# Patient Record
Sex: Female | Born: 1980 | Race: White | Hispanic: No | Marital: Married
Health system: Midwestern US, Community
[De-identification: ages and names within clinical notes are randomized; demographics above are authoritative.]

## PROBLEM LIST (undated history)

## (undated) DIAGNOSIS — R300 Dysuria: Secondary | ICD-10-CM

## (undated) DIAGNOSIS — R310 Gross hematuria: Secondary | ICD-10-CM

## (undated) DIAGNOSIS — N390 Urinary tract infection, site not specified: Secondary | ICD-10-CM

## (undated) DIAGNOSIS — I341 Nonrheumatic mitral (valve) prolapse: Secondary | ICD-10-CM

## (undated) DIAGNOSIS — E039 Hypothyroidism, unspecified: Secondary | ICD-10-CM

## (undated) HISTORY — DX: Hypothyroidism, unspecified: E03.9

## (undated) HISTORY — DX: Nonrheumatic mitral (valve) prolapse: I34.1

---

## 2007-09-11 HISTORY — PX: WISDOM TOOTH EXTRACTION: SHX21

## 2011-06-06 ENCOUNTER — Ambulatory Visit (INDEPENDENT_AMBULATORY_CARE_PROVIDER_SITE_OTHER): Payer: PRIVATE HEALTH INSURANCE | Admitting: Family Medicine

## 2011-06-06 ENCOUNTER — Encounter: Payer: Self-pay | Admitting: Family Medicine

## 2011-06-06 DIAGNOSIS — Z331 Pregnant state, incidental: Secondary | ICD-10-CM | POA: Insufficient documentation

## 2011-06-06 DIAGNOSIS — B079 Viral wart, unspecified: Secondary | ICD-10-CM

## 2011-06-06 NOTE — Patient Instructions (Signed)
Follow up in 2-3 weeks for repeat freeze.

## 2011-06-06 NOTE — Progress Notes (Signed)
  Subjective:    Patient ID: Debra Singleton, female    DOB: May 16, 1981, 30 y.o.   MRN: [id number]  HPI She is pregnant and takes a MVI. Followed at [facility].  Had Pneaumonia shot in 2004.  Pap smera  In 8/12  Last week had ST post nasal drip, and some cough but feels she is getting better. No fever. Used some Tylenol sinu Pain and Congestion.     Wart on thumb and finger (RT hand). No OTC tx.  She says has already tried the duck tape method and didn't work well.   Review of Systems  Constitutional: Negative for fever, diaphoresis and unexpected weight change.  HENT: Negative for hearing loss, rhinorrhea and tinnitus.   Eyes: Negative for visual disturbance.  Respiratory: Positive for cough. Negative for wheezing.   Cardiovascular: Negative for chest pain and palpitations.  Gastrointestinal: Negative for nausea, vomiting, diarrhea and blood in stool.  Genitourinary: Negative for vaginal bleeding, vaginal discharge and difficulty urinating.  Musculoskeletal: Negative for myalgias and arthralgias.  Skin: Negative for rash.  Neurological: Negative for headaches.  Hematological: Negative for adenopathy. Does not bruise/bleed easily.  Psychiatric/Behavioral: Negative for sleep disturbance and dysphoric mood. The patient is not nervous/anxious.    BP 116/67  Pulse 74  Ht 5' 7.5" (1.715 m)  Wt 151 lb (68.493 kg)  BMI 23.30 kg/m2    No Known Allergies  History reviewed. No pertinent past medical history.  Past Surgical History  Procedure Date  . Wisdom tooth extraction 2009    History   Social History  . Marital Status: Married    Spouse Name: N/A    Number of Children: N/A  . Years of Education: N/A   Occupational History  . Not on file.   Social History Main Topics  . Smoking status: Never Smoker   . Smokeless tobacco: Not on file  . Alcohol Use: No  . Drug Use: No  . Sexually Active: Yes   Other Topics Concern  . Not on file   Social History Narrative   Exercises regularly.  Rare caffeine     Family History  Problem Relation Age of Onset  . Colon cancer Paternal Grandmother     smoker  . Heart attack Maternal Grandfather   . Diabetes Paternal Grandmother   . Hyperlipidemia      grandparent.   . Hypertension      grandparents  . Heart attack Paternal Grandfather     Debra Singleton does not currently have medications on file.     Objective:   Physical Exam  Constitutional: She is oriented to person, place, and time. She appears well-developed and well-nourished.  HENT:  Head: Normocephalic and atraumatic.  Cardiovascular: Normal rate, regular rhythm and normal heart sounds.   Pulmonary/Chest: Effort normal and breath sounds normal.  Neurological: She is alert and oriented to person, place, and time.  Skin: Skin is warm and dry.       Wart on tip of rt thumb and first finger on the Rt hand .  Psychiatric: She has a normal mood and affect. Her behavior is normal.          Assessment & Plan:  URI- Resolving.  Call if not continuing to improve.   Wart on RT hand - Cryotherapy performed. Pt tolerated well. F/U in 2-3 weeks for refreeze.

## 2011-06-19 ENCOUNTER — Encounter: Payer: Self-pay | Admitting: Family Medicine

## 2011-06-19 ENCOUNTER — Ambulatory Visit (INDEPENDENT_AMBULATORY_CARE_PROVIDER_SITE_OTHER): Payer: PRIVATE HEALTH INSURANCE | Admitting: Family Medicine

## 2011-06-19 DIAGNOSIS — B079 Viral wart, unspecified: Secondary | ICD-10-CM

## 2011-06-19 NOTE — Progress Notes (Signed)
  Subjective:    Patient ID: Debra Singleton, female    DOB: 1981/09/07, 30 y.o.   MRN: [id number]  HPI  Here for refreeze on wart.    Review of Systems     Objective:   Physical Exam        Assessment & Plan:  She has thus far had a good response to the first freezing.  Cyrotherapy performed today.  Pt tolerated well. FU in 2 weeks for refreeze.

## 2011-07-26 ENCOUNTER — Encounter: Payer: Self-pay | Admitting: Family Medicine

## 2011-07-26 ENCOUNTER — Ambulatory Visit (INDEPENDENT_AMBULATORY_CARE_PROVIDER_SITE_OTHER): Payer: PRIVATE HEALTH INSURANCE | Admitting: Family Medicine

## 2011-07-26 DIAGNOSIS — B079 Viral wart, unspecified: Secondary | ICD-10-CM

## 2011-07-26 NOTE — Progress Notes (Signed)
  Subjective:    Patient ID: Debra Singleton, female    DOB: [birth year], 30 y.o.   MRN: [id number]  HPI  Cryotherapy on 2 warts on her hand.  Followup.   Review of Systems     Objective:   Physical Exam        Assessment & Plan:  Cryo performed on 2 warts. Pt tolerated well.

## 2011-08-13 ENCOUNTER — Encounter: Payer: Self-pay | Admitting: Family Medicine

## 2011-08-13 ENCOUNTER — Ambulatory Visit (INDEPENDENT_AMBULATORY_CARE_PROVIDER_SITE_OTHER): Payer: PRIVATE HEALTH INSURANCE | Admitting: Family Medicine

## 2011-08-13 DIAGNOSIS — B079 Viral wart, unspecified: Secondary | ICD-10-CM

## 2011-08-13 NOTE — Patient Instructions (Signed)
Cal if not getting good response to this freezine.

## 2011-08-13 NOTE — Progress Notes (Signed)
  Subjective:    Patient ID: Debra Singleton, female    DOB: 01-Jul-1981, 30 y.o.   MRN: [id number]  HPI Here for repeat cryo on her first and second finger.  She has toleated this well.  They look about the same.  She tried to debride them some before coming in.      Review of Systems     Objective:   Physical Exam  Cryo performed on the first finger and thumb for wart.        Assessment & Plan:  Cryotherapy - Pt tolerated well. Will place Emla cream on fingertips and should help numb.  If not helping then consider refer to Dermatology for further evaluation.

## 2012-01-30 ENCOUNTER — Encounter: Payer: Self-pay | Admitting: Physician Assistant

## 2012-01-30 ENCOUNTER — Ambulatory Visit (INDEPENDENT_AMBULATORY_CARE_PROVIDER_SITE_OTHER): Payer: PRIVATE HEALTH INSURANCE | Admitting: Physician Assistant

## 2012-01-30 VITALS — BP 117/74 | HR 84 | Ht 67.0 in | Wt 163.0 lb

## 2012-01-30 DIAGNOSIS — R202 Paresthesia of skin: Secondary | ICD-10-CM

## 2012-01-30 DIAGNOSIS — R209 Unspecified disturbances of skin sensation: Secondary | ICD-10-CM

## 2012-01-30 DIAGNOSIS — R200 Anesthesia of skin: Secondary | ICD-10-CM

## 2012-01-30 DIAGNOSIS — I341 Nonrheumatic mitral (valve) prolapse: Secondary | ICD-10-CM

## 2012-01-30 DIAGNOSIS — R7989 Other specified abnormal findings of blood chemistry: Secondary | ICD-10-CM

## 2012-01-30 DIAGNOSIS — F419 Anxiety disorder, unspecified: Secondary | ICD-10-CM

## 2012-01-30 MED ORDER — SERTRALINE HCL 50 MG PO TABS: 50.0000 mg | ORAL_TABLET | Freq: Every day | ORAL | Status: DC

## 2012-01-30 NOTE — Progress Notes (Signed)
  Subjective:    Patient ID: Debra Singleton, female    DOB: 04/20/1981, 31 y.o.   MRN: [id number]  HPI Patient presents to the clinic because last night she started feeling week all of a sudden after taking a nap. She thought is was low blood sugar because she had that before. Ginger ale did not help and she still remained feeling weak along with numbness and tingling down both arms into hands. She did not pass out or feel dizzy. She denies vision changes. She felt like her tongue was huge. She denies eating anything or being exposed to anything she was allergic too. She did take benadryl and it did not help. When she layed down the tingling went into her jaw and got worse. She denies any CP or tightness, palpitations, or SOB. She felt like she had to force herself to talk. She drank orange juice and then then 30 minutes later she began to feel better. She denies any neck trauma or injury. Her extremities were very cold during episode. She has just had a baby 2 months ago. She denies depression but has been feeling a little anxious lately. Pt does not think this was due to anxiety. She does not have a hx of multiple sclerosis. This was first episode. She does have a hx of MVP.       Review of Systems     Objective:   Physical Exam  Constitutional: She is oriented to person, place, and time. She appears well-developed and well-nourished.  HENT:  Head: Normocephalic and atraumatic.  Neck: Normal range of motion. Neck supple.       Normal neck ROM. No tenderness to palpation of neck.  Cardiovascular: Normal rate, regular rhythm and intact distal pulses.        Mid-systolic click barely heard with heart sounds.    Pulmonary/Chest: Effort normal and breath sounds normal. She has no wheezes.  Musculoskeletal:       Upper extremitity strength 5/5. Normal ROM of both arms and hands. Hand strength bilaterally 5/5. DTR's 2+ antecubital. No tenderness to palpation on either arm.   Neurological: She  is alert and oriented to person, place, and time.  Skin: Skin is warm and dry.  Psychiatric:       Very anxious today.          Assessment & Plan:  Numbness and tingling of both hands and arms/Anxiety- EKG sinus arrhytmia, no st elevation or depression, normal axis and rate. Suspect there might be some Anxiety related compenent. 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 Encouraged to start Zoloft 50mg  1/2 tab for 7 days then increase to 1 tab daily. Will check electrolytes, TSH, CBC and call with results. Want to rule out anemia post-partum, electrolyte embalance, or TSH problems.  Pt instructed to call office if have another episode like this one. Encouraged her to stay hydrated. Follow up in 6 weeks.

## 2012-01-30 NOTE — Patient Instructions (Addendum)
Start Zoloft 1/2 tab for 7 days then increase to 1 tab. Recheck in 6 weeks.  If have another exacerbation please call office and we can get stress test. Will call with labs.

## 2012-01-31 ENCOUNTER — Other Ambulatory Visit: Payer: Self-pay | Admitting: Physician Assistant

## 2012-01-31 ENCOUNTER — Telehealth: Payer: Self-pay | Admitting: *Deleted

## 2012-01-31 DIAGNOSIS — I341 Nonrheumatic mitral (valve) prolapse: Secondary | ICD-10-CM

## 2012-01-31 LAB — COMPLETE METABOLIC PANEL WITH GFR
AST: 20 U/L (ref 0–37)
Albumin: 4.6 g/dL (ref 3.5–5.2)
BUN: 25 mg/dL — ABNORMAL HIGH (ref 6–23)
CO2: 24 mEq/L (ref 19–32)
Calcium: 9.5 mg/dL (ref 8.4–10.5)
Chloride: 104 mEq/L (ref 96–112)
Creat: 0.96 mg/dL (ref 0.50–1.10)
GFR, Est African American: >89 mL/min
GFR, Est Non African American: 80 mL/min
Glucose, Bld: 84 mg/dL (ref 70–99)
Potassium: 4.2 mEq/L (ref 3.5–5.3)

## 2012-01-31 LAB — TSH: TSH: 6.214 u[IU]/mL — ABNORMAL HIGH (ref 0.350–4.500)

## 2012-01-31 LAB — CBC WITH DIFFERENTIAL/PLATELET
Basophils Absolute: 0 10*3/uL (ref 0.0–0.1)
Eosinophils Relative: 7 % — ABNORMAL HIGH (ref 0–5)
Lymphocytes Relative: 34 % (ref 12–46)
MCV: 90.3 fL (ref 78.0–100.0)
Neutrophils Relative %: 51 % (ref 43–77)
Platelets: 275 10*3/uL (ref 150–400)
RBC: 4.87 MIL/uL (ref 3.87–5.11)
RDW: 13.4 % (ref 11.5–15.5)
WBC: 7.9 10*3/uL (ref 4.0–10.5)

## 2012-01-31 MED ORDER — LEVOTHYROXINE SODIUM 100 MCG PO CAPS: 100.0000 ug | ORAL_CAPSULE | Freq: Every day | ORAL | Status: DC

## 2012-01-31 NOTE — Progress Notes (Signed)
rx sent

## 2012-01-31 NOTE — Telephone Encounter (Signed)
Pt calls and wants lab results and explained you were out of office and we would contact her tomorrow with results. Pt also request to have a cardiology referral because of her mitral valve and the fact that she will be loosing insurance soon

## 2012-02-01 ENCOUNTER — Other Ambulatory Visit: Payer: Self-pay | Admitting: Physician Assistant

## 2012-02-01 ENCOUNTER — Encounter: Payer: Self-pay | Admitting: Physician Assistant

## 2012-02-01 ENCOUNTER — Encounter: Payer: Self-pay | Admitting: *Deleted

## 2012-02-01 DIAGNOSIS — I341 Nonrheumatic mitral (valve) prolapse: Secondary | ICD-10-CM | POA: Insufficient documentation

## 2012-02-01 DIAGNOSIS — O905 Postpartum thyroiditis: Secondary | ICD-10-CM | POA: Insufficient documentation

## 2012-02-01 LAB — T4, FREE: Free T4: 1.21 ng/dL (ref 0.80–1.80)

## 2012-02-01 NOTE — Telephone Encounter (Signed)
Let patient know that referral has been sent. If not called in next week call office back.

## 2012-02-01 NOTE — Progress Notes (Signed)
Addended by: Ellsworth Lennox on: 02/01/2012 09:43 AM   Modules accepted: Orders

## 2012-02-05 ENCOUNTER — Telehealth: Payer: Self-pay | Admitting: *Deleted

## 2012-02-05 NOTE — Telephone Encounter (Signed)
Pt ask to inform you that she started taking the Levothyroxine on Saturday.

## 2012-02-06 ENCOUNTER — Encounter: Payer: Self-pay | Admitting: Cardiology

## 2012-02-06 ENCOUNTER — Ambulatory Visit (INDEPENDENT_AMBULATORY_CARE_PROVIDER_SITE_OTHER): Payer: PRIVATE HEALTH INSURANCE | Admitting: Cardiology

## 2012-02-06 DIAGNOSIS — R079 Chest pain, unspecified: Secondary | ICD-10-CM

## 2012-02-06 DIAGNOSIS — R0989 Other specified symptoms and signs involving the circulatory and respiratory systems: Secondary | ICD-10-CM

## 2012-02-06 DIAGNOSIS — R0600 Dyspnea, unspecified: Secondary | ICD-10-CM | POA: Insufficient documentation

## 2012-02-06 DIAGNOSIS — I059 Rheumatic mitral valve disease, unspecified: Secondary | ICD-10-CM

## 2012-02-06 DIAGNOSIS — O905 Postpartum thyroiditis: Secondary | ICD-10-CM

## 2012-02-06 DIAGNOSIS — O99285 Endocrine, nutritional and metabolic diseases complicating the puerperium: Secondary | ICD-10-CM

## 2012-02-06 DIAGNOSIS — R0609 Other forms of dyspnea: Secondary | ICD-10-CM

## 2012-02-06 DIAGNOSIS — I341 Nonrheumatic mitral (valve) prolapse: Secondary | ICD-10-CM

## 2012-02-06 NOTE — Assessment & Plan Note (Signed)
Patient does not appear to be volume overloaded on examination. Electrocardiogram normal. Given recent delivery check d-dimer. I think pulmonary embolus is less likely but worthwhile to exclude. If elevated we'll proceed with CT scan.

## 2012-02-06 NOTE — Assessment & Plan Note (Signed)
Management per primary care. 

## 2012-02-06 NOTE — Assessment & Plan Note (Signed)
Repeat echocardiogram. 

## 2012-02-06 NOTE — Progress Notes (Signed)
  HPI: 31 year old female with past medical history of mitral valve prolapse for evaluation of dyspnea. Patient is status post recent delivery of her second child 2 months ago. Last week she had an episode where she felt suddenly fatigued. She sat down and then developed numbness in her upper extremities bilaterally. She also had some difficulty speaking because of the tongue swelling. She also has noticed some dyspnea on exertion which is new. There is no orthopnea, PND or pedal edema. She has not had syncope. No exertional chest pain. Because of the above she presented for further evaluation. Note her TSH was noted to be increased and she has been started on levothyroxine.  Current Outpatient Prescriptions  Medication Sig Dispense Refill  . Levothyroxine Sodium 100 MCG CAPS Take 1 capsule (100 mcg total) by mouth daily. 30 minutes before breakfast.  30 capsule  1  . Prenatal Vitamins (DIS) TABS Take by mouth.        . sertraline (ZOLOFT) 50 MG tablet Take 1 tablet (50 mg total) by mouth daily. Take 1/2 tab for 7 days and then increase to 1 day.  30 tablet  1    No Known Allergies  Past Medical History  Diagnosis Date  . Hypothyroid   . Mitral valve prolapse     Past Surgical History  Procedure Date  . Wisdom tooth extraction 2009    History   Social History  . Marital Status: Married    Spouse Name: N/A    Number of Children: 2  . Years of Education: N/A   Occupational History  .      Registered Nurse   Social History Main Topics  . Smoking status: Never Smoker   . Smokeless tobacco: Not on file  . Alcohol Use: Yes     Rare  . Drug Use: No  . Sexually Active: Yes   Other Topics Concern  . Not on file   Social History Narrative   Exercises regularly.  Rare caffeine     Family History  Problem Relation Age of Onset  . Colon cancer Paternal Grandmother     smoker  . Heart attack Maternal Grandfather   . Diabetes Paternal Grandmother   . Hyperlipidemia     grandparent.   . Hypertension      grandparents  . Heart attack Paternal Grandfather     ROS:  no fevers or chills, productive cough, hemoptysis, dysphasia, odynophagia, melena, hematochezia, dysuria, hematuria, rash, seizure activity, orthopnea, PND, pedal edema, claudication. Remaining systems are negative.  Physical Exam:   Blood pressure 110/72, pulse 68, height 5\' 7"  (1.702 m), weight 73.936 kg (163 lb).  General:  Well developed/well nourished in NAD Skin warm/dry Patient not depressed No peripheral clubbing Back-normal HEENT-normal/normal eyelids Neck supple/normal carotid upstroke bilaterally; no bruits; no JVD; no thyromegaly chest - CTA/ normal expansion CV - RRR/normal S1 and S2; no rubs or gallops;  PMI nondisplaced, 1/6 systolic ejection murmur Abdomen -NT/ND, no HSM, no mass, + bowel sounds, no bruit 2+ femoral pulses, no bruits Ext-no edema, chords, 2+ DP Neuro-grossly nonfocal  ECG 005/20/2013-sinus rhythm with no ST changes.

## 2012-02-06 NOTE — Patient Instructions (Signed)
Your physician recommends that you schedule a follow-up appointment in: AS NEEDED PENDING TEST RESULTS  Your physician has requested that you have an echocardiogram. Echocardiography is a painless test that uses sound waves to create images of your heart. It provides your doctor with information about the size and shape of your heart and how well your heart's chambers and valves are working. This procedure takes approximately one hour. There are no restrictions for this procedure.   Your physician recommends that you HAVE LAB WORK TODAY  

## 2012-02-07 LAB — D-DIMER, QUANTITATIVE: D-Dimer, Quant: 0.28 ug/mL-FEU (ref 0.00–0.48)

## 2012-02-12 ENCOUNTER — Ambulatory Visit (HOSPITAL_COMMUNITY): Payer: PRIVATE HEALTH INSURANCE | Attending: Cardiovascular Disease | Admitting: Radiology

## 2012-02-12 DIAGNOSIS — R0609 Other forms of dyspnea: Secondary | ICD-10-CM | POA: Insufficient documentation

## 2012-02-12 DIAGNOSIS — R011 Cardiac murmur, unspecified: Secondary | ICD-10-CM | POA: Insufficient documentation

## 2012-02-12 DIAGNOSIS — I059 Rheumatic mitral valve disease, unspecified: Secondary | ICD-10-CM | POA: Insufficient documentation

## 2012-02-12 DIAGNOSIS — R072 Precordial pain: Secondary | ICD-10-CM | POA: Insufficient documentation

## 2012-02-12 DIAGNOSIS — R0989 Other specified symptoms and signs involving the circulatory and respiratory systems: Secondary | ICD-10-CM | POA: Insufficient documentation

## 2012-02-12 DIAGNOSIS — R5381 Other malaise: Secondary | ICD-10-CM | POA: Insufficient documentation

## 2012-02-12 NOTE — Progress Notes (Signed)
Echocardiogram performed.  

## 2012-03-02 ENCOUNTER — Other Ambulatory Visit: Payer: Self-pay | Admitting: Physician Assistant

## 2012-03-05 ENCOUNTER — Telehealth: Payer: Self-pay | Admitting: *Deleted

## 2012-03-05 NOTE — Telephone Encounter (Signed)
Pt informed

## 2012-03-05 NOTE — Telephone Encounter (Signed)
No safe meds since breast feeding. Stay hydrated. Ginger is something natural that you can try to help with nausea.

## 2012-03-05 NOTE — Telephone Encounter (Signed)
Pt states she has been up since about 2 am with vomiting and diarrhea. States she is also breast feeding and that she hasn't taken her Synthroid due to not being able to keep anything down. Pt would like to know if we can call in something for the n/v. Please advise

## 2012-03-10 ENCOUNTER — Encounter: Payer: Self-pay | Admitting: Family Medicine

## 2012-03-10 ENCOUNTER — Ambulatory Visit (INDEPENDENT_AMBULATORY_CARE_PROVIDER_SITE_OTHER): Payer: PRIVATE HEALTH INSURANCE | Admitting: Family Medicine

## 2012-03-10 VITALS — BP 106/70 | HR 72 | Ht 67.0 in | Wt 158.0 lb

## 2012-03-10 DIAGNOSIS — E079 Disorder of thyroid, unspecified: Secondary | ICD-10-CM

## 2012-03-10 DIAGNOSIS — O905 Postpartum thyroiditis: Secondary | ICD-10-CM

## 2012-03-10 DIAGNOSIS — E039 Hypothyroidism, unspecified: Secondary | ICD-10-CM

## 2012-03-10 NOTE — Progress Notes (Signed)
  Subjective:    Patient ID: Debra Singleton, female    DOB: 01/15/1981, 31 y.o.   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 Was complaining of severe fatigue initiallly and found the hypothyroid on labwork. t3 and t4 was normal. She feels it is from new baby and has been nursing and has been pumping.  She has gastroenteritis last week.  Says tha tis  Better. Fatigue is better.  Says hand numbness is better.  Says the other night couldn't sleep and wonders if dose on med may be too much.  No sweats.  Says hairloss has improved.  Skin has improved.      Review of Systems     Objective:   Physical Exam  Constitutional: She is oriented to person, place, and time. She appears well-developed and well-nourished.  HENT:  Head: Normocephalic and atraumatic.  Eyes: Conjunctivae are normal. Pupils are equal, round, and reactive to light.  Neck: Neck supple. No tracheal deviation present. No thyromegaly present.       Thyroid is nonenlarged. Mildly tender on exam. No distinct nodules. No induration.  Cardiovascular: Normal rate, regular rhythm and normal heart sounds.   Pulmonary/Chest: Effort normal and breath sounds normal.  Musculoskeletal: She exhibits no edema.  Lymphadenopathy:    She has no cervical adenopathy.  Neurological: She is alert and oriented to person, place, and time.  Skin: Skin is warm and dry.  Psychiatric: She has a normal mood and affect. Her behavior is normal.          Assessment & Plan:  Hypothyroid - Recheck TSH, Free T3, Free t4. Will call with results.  May need to adjust her dose, sounds like she may be hyperthyroid.    Pt never took the zoloft. I think most of her symptoms were explained by the hyperthyroidism. I see no signs of anxiety or depression today.  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

## 2012-03-10 NOTE — Patient Instructions (Addendum)
We will call you with your lab results. If you don't here from us in about a week then please give us a call at [phone].  

## 2012-03-11 ENCOUNTER — Other Ambulatory Visit: Payer: Self-pay | Admitting: Family Medicine

## 2012-03-11 LAB — T4, FREE: Free T4: 1.4 ng/dL (ref 0.80–1.80)

## 2012-03-11 MED ORDER — LEVOTHYROXINE SODIUM 88 MCG PO TABS: 88.0000 ug | ORAL_TABLET | Freq: Every day | ORAL | Status: DC

## 2012-05-08 ENCOUNTER — Telehealth: Payer: Self-pay | Admitting: *Deleted

## 2012-05-08 DIAGNOSIS — E039 Hypothyroidism, unspecified: Secondary | ICD-10-CM

## 2012-05-09 LAB — TSH: TSH: 0.905 u[IU]/mL (ref 0.350–4.500)

## 2012-05-14 ENCOUNTER — Other Ambulatory Visit: Payer: Self-pay | Admitting: Family Medicine

## 2012-05-14 ENCOUNTER — Other Ambulatory Visit: Payer: Self-pay | Admitting: *Deleted

## 2012-05-14 MED ORDER — LEVOTHYROXINE SODIUM 88 MCG PO TABS: 88.0000 ug | ORAL_TABLET | Freq: Every day | ORAL | Status: DC

## 2012-08-26 ENCOUNTER — Other Ambulatory Visit: Payer: Self-pay

## 2012-08-26 MED ORDER — LEVOTHYROXINE SODIUM 88 MCG PO TABS: 88.0000 ug | ORAL_TABLET | Freq: Every day | ORAL | Status: DC

## 2012-09-22 ENCOUNTER — Encounter: Payer: Self-pay | Admitting: Physician Assistant

## 2012-09-22 ENCOUNTER — Ambulatory Visit (INDEPENDENT_AMBULATORY_CARE_PROVIDER_SITE_OTHER): Payer: BC Managed Care – PPO | Admitting: Physician Assistant

## 2012-09-22 VITALS — BP 116/74 | HR 74 | Wt 145.0 lb

## 2012-09-22 DIAGNOSIS — E039 Hypothyroidism, unspecified: Secondary | ICD-10-CM

## 2012-09-22 DIAGNOSIS — R319 Hematuria, unspecified: Secondary | ICD-10-CM

## 2012-09-22 DIAGNOSIS — O905 Postpartum thyroiditis: Secondary | ICD-10-CM

## 2012-09-22 DIAGNOSIS — N390 Urinary tract infection, site not specified: Secondary | ICD-10-CM

## 2012-09-22 DIAGNOSIS — R300 Dysuria: Secondary | ICD-10-CM

## 2012-09-22 LAB — POCT URINALYSIS DIPSTICK
Ketones, UA: NEGATIVE
Protein, UA: 100
Spec Grav, UA: >=1.03
Urobilinogen, UA: 0.2
pH, UA: 5.5

## 2012-09-22 MED ORDER — CIPROFLOXACIN HCL 500 MG PO TABS: 500.0000 mg | ORAL_TABLET | Freq: Two times a day (BID) | ORAL | Status: DC

## 2012-09-22 NOTE — Patient Instructions (Signed)
Urinary Tract Infection Urinary tract infections (UTIs) can develop anywhere along your urinary tract. Your urinary tract is your body's drainage system for removing wastes and extra water. Your urinary tract includes two kidneys, two ureters, a bladder, and a urethra. Your kidneys are a pair of bean-shaped organs. Each kidney is about the size of your fist. They are located below your ribs, one on each side of your spine. CAUSES Infections are caused by microbes, which are microscopic organisms, including fungi, viruses, and bacteria. These organisms are so small that they can only be seen through a microscope. Bacteria are the microbes that most commonly cause UTIs. SYMPTOMS  Symptoms of UTIs may vary by age and gender of the patient and by the location of the infection. Symptoms in young women typically include a frequent and intense urge to urinate and a painful, burning feeling in the bladder or urethra during urination. Older women and men are more likely to be tired, shaky, and weak and have muscle aches and abdominal pain. A fever may mean the infection is in your kidneys. Other symptoms of a kidney infection include pain in your back or sides below the ribs, nausea, and vomiting. DIAGNOSIS To diagnose a UTI, your caregiver will ask you about your symptoms. Your caregiver also will ask to provide a urine sample. The urine sample will be tested for bacteria and white blood cells. White blood cells are made by your body to help fight infection. TREATMENT  Typically, UTIs can be treated with medication. Because most UTIs are caused by a bacterial infection, they usually can be treated with the use of antibiotics. The choice of antibiotic and length of treatment depend on your symptoms and the type of bacteria causing your infection. HOME CARE INSTRUCTIONS  If you were prescribed antibiotics, take them exactly as your caregiver instructs you. Finish the medication even if you feel better after you  have only taken some of the medication.  Drink enough water and fluids to keep your urine clear or pale yellow.  Avoid caffeine, tea, and carbonated beverages. They tend to irritate your bladder.  Empty your bladder often. Avoid holding urine for long periods of time.  Empty your bladder before and after sexual intercourse.  After a bowel movement, women should cleanse from front to back. Use each tissue only once. SEEK MEDICAL CARE IF:   You have back pain.  You develop a fever.  Your symptoms do not begin to resolve within 3 days. SEEK IMMEDIATE MEDICAL CARE IF:   You have severe back pain or lower abdominal pain.  You develop chills.  You have nausea or vomiting.  You have continued burning or discomfort with urination. MAKE SURE YOU:   Understand these instructions.  Will watch your condition.  Will get help right away if you are not doing well or get worse. Document Released: 06/06/2005 Document Revised: 02/26/2012 Document Reviewed: 10/05/2011 [employer] Patient Information 2013 [employer].  

## 2012-09-22 NOTE — Progress Notes (Signed)
  Subjective:    Patient ID: Debra Singleton, female    DOB: 03-29-1981, 31 y.o.   MRN: [id number]  Dysuria  This is a new problem. The current episode started yesterday. The problem occurs every urination. The problem has been rapidly worsening. The quality of the pain is described as aching, burning and shooting. The pain is at a severity of 7/10. The pain is moderate. There has been no fever. She is sexually active. There is no history of pyelonephritis. Associated symptoms include frequency, hematuria and urgency. Pertinent negatives include no chills, discharge, flank pain, hesitancy, nausea, possible pregnancy, sweats or vomiting. She has tried nothing for the symptoms. The treatment provided no relief. Last UTI was last year.   Patient also has post-partum thyroiditis. She is on synthyroid. She was supposed to come in within the next couple weeks for TSH recheck. She reports that she is feeling great on current dose. She has plenty of energy and is exercising regularly. She denies any tenderness or neck swelling around the thyroid gland. She recently stopped breast-feeding at the beginning of December. She is able to sleep well throughout the night.   Review of Systems  Constitutional: Negative for chills.  Gastrointestinal: Negative for nausea and vomiting.  Genitourinary: Positive for dysuria, urgency, frequency and hematuria. Negative for hesitancy and flank pain.       Objective:   Physical Exam  Constitutional: She is oriented to person, place, and time. She appears well-developed and well-nourished.  HENT:  Head: Normocephalic and atraumatic.  Neck: Normal range of motion. Neck supple. No thyromegaly present.       Or tenderness with palpation over the thyroid.  Cardiovascular: Normal rate, regular rhythm and normal heart sounds.   Pulmonary/Chest: Effort normal and breath sounds normal.  Abdominal: Soft. Bowel sounds are normal. She exhibits no distension and no mass. There is  no guarding.       Mild tenderness over the lower abdominal quadrant bilaterally.  Neurological: She is alert and oriented to person, place, and time.  Skin: Skin is warm and dry.  Psychiatric: She has a normal mood and affect. Her behavior is normal.          Assessment & Plan:  UTI-UA was positive for blood, nitrates, and leukocyte. She was treated with 3 days of Cipro. She is given a handout for symptomatic treatment of UTI. She was encouraged to try a set of for 3 days over-the-counter for any bladder irritation. Followup if not improving.  Postpartum thyroiditis/hypothyroidism-patient was given lab slip to have TSH drawn. We'll make changes as needed.

## 2012-10-28 LAB — TSH: TSH: 0.651 u[IU]/mL (ref 0.350–4.500)

## 2012-10-29 ENCOUNTER — Other Ambulatory Visit: Payer: Self-pay | Admitting: *Deleted

## 2012-10-29 MED ORDER — LEVOTHYROXINE SODIUM 88 MCG PO TABS: 88.0000 ug | ORAL_TABLET | Freq: Every day | ORAL | Status: DC

## 2013-02-27 ENCOUNTER — Ambulatory Visit (INDEPENDENT_AMBULATORY_CARE_PROVIDER_SITE_OTHER): Payer: PRIVATE HEALTH INSURANCE | Admitting: Physician Assistant

## 2013-02-27 ENCOUNTER — Encounter: Payer: Self-pay | Admitting: Physician Assistant

## 2013-02-27 VITALS — BP 130/77 | HR 71 | Wt 139.0 lb

## 2013-02-27 DIAGNOSIS — O905 Postpartum thyroiditis: Secondary | ICD-10-CM

## 2013-02-27 DIAGNOSIS — R8271 Bacteriuria: Secondary | ICD-10-CM

## 2013-02-27 DIAGNOSIS — S060X0D Concussion without loss of consciousness, subsequent encounter: Secondary | ICD-10-CM

## 2013-02-27 DIAGNOSIS — S060X9A Concussion with loss of consciousness of unspecified duration, initial encounter: Secondary | ICD-10-CM | POA: Insufficient documentation

## 2013-02-27 DIAGNOSIS — E079 Disorder of thyroid, unspecified: Secondary | ICD-10-CM

## 2013-02-27 DIAGNOSIS — R82998 Other abnormal findings in urine: Secondary | ICD-10-CM

## 2013-02-27 DIAGNOSIS — R300 Dysuria: Secondary | ICD-10-CM

## 2013-02-27 DIAGNOSIS — Z5189 Encounter for other specified aftercare: Secondary | ICD-10-CM

## 2013-02-27 LAB — POCT URINALYSIS DIPSTICK
Bilirubin, UA: NEGATIVE
Blood, UA: NEGATIVE
Glucose, UA: NEGATIVE
Nitrite, UA: NEGATIVE
Spec Grav, UA: 1.025
pH, UA: 5.5

## 2013-02-27 MED ORDER — LEVOTHYROXINE SODIUM 88 MCG PO TABS: 88.0000 ug | ORAL_TABLET | Freq: Every day | ORAL | Status: DC

## 2013-02-27 NOTE — Patient Instructions (Addendum)
Asymptomatic Bacteriuria, Female Your urine study shows bacteria in your urine. You do not have the usual symptoms of burning or frequent urination. This is why it is called asymptomatic. You may need treatment with antibiotics. Treatment is especially important if you are pregnant. Sometimes this condition can progress to a more severe bladder or kidney infection. Symptoms include burning when urinating, back pain, fever, nausea, or vomiting. Take your antibiotics as directed. Finish them even if you start to feel better. Drink enough water and fluids to keep your urine clear or pale yellow. Go to the bathroom more frequently to keep your bladder empty. Keep the area around the vagina and rectum clean. Wipe yourself from front to back after urinating. Call your caregiver to arrange for follow-up care.  SEEK IMMEDIATE MEDICAL CARE IF:  You develop repeated vomiting.  You develop severe back or abdominal pain.  You have abnormal vaginal discharge or bleeding.  You have blood in the urine.  You develop cramping or abdominal pain.  You have a fever. If you are pregnant and develop any of the above problems see your caregiver or seek care immediately. Document Released: 08/27/2005 Document Revised: 11/19/2011 Document Reviewed: 07/13/2009 [employer] Patient Information 2014 [employer].  

## 2013-02-27 NOTE — Progress Notes (Signed)
  Subjective:    Patient ID: Debra Singleton, female    DOB: 1980/11/13, 32 y.o.   MRN: [id number]  HPI Patient presents to the clinic with " a funny feeling when she uses the bathroom". She took an OtC UTI test and was positive for leuks. Wanted to get checked out before the weekend. Denies any fever, chills, back pain, blood in urine, urinary frequency or urgency.  She also wanted to make [place] aware of a concussion on May 31st after hitting the back of her head on the side of a car door. She went to [facility] MRI was normal/BMP normal/ Cbc normal. She had symptoms of dizziness, difficultly focusing, HA's for 2 weeks but is starting to feel much better. Still has dizziness with sudden change in motion. Wants to know when to start back exercising.   Post-partum hypothyroidism controlled with symptoms TSH 1.43 in hospital. Needs refills.         Review of Systems     Objective:   Physical Exam  Constitutional: She is oriented to person, place, and time. She appears well-developed and well-nourished.  HENT:  Head: Normocephalic and atraumatic.  Eyes: Conjunctivae and EOM are normal. Pupils are equal, round, and reactive to light.  Cardiovascular: Normal rate, regular rhythm and normal heart sounds.   Pulmonary/Chest: Effort normal and breath sounds normal.  No CVA tenderness.   Abdominal: Soft. Bowel sounds are normal. There is no tenderness.  Neurological: She is alert and oriented to person, place, and time. No cranial nerve deficit.  Skin: Skin is warm and dry.  Psychiatric: She has a normal mood and affect. Her behavior is normal.          Assessment & Plan:  Asymptomatic bacteremia- Trace Leuks only. Will send for culture. Gave HO. Follow up if not improving.   Post-partum thyroiditis- TSH at hosptial was 1.43 in normal range. REfilled levothyroxine for 6 months.   Concussion- Reassured patient that symptoms can take time for all to resolve. Discussed to give 6  weeks from concussion before resuming physical exercise to allow brain time to heal. Discuss worry with conconsion is second impact syndrome. When start exercise watch for symptoms of concussion and go slow. Follow up with neurologist on Monday.   Spent 30 minutes with patient and greater than 50 percent of visit spent counseling regarding concussions.

## 2013-03-02 ENCOUNTER — Other Ambulatory Visit: Payer: Self-pay | Admitting: Physician Assistant

## 2013-03-02 LAB — URINE CULTURE: Colony Count: >=100000

## 2013-03-02 MED ORDER — CIPROFLOXACIN HCL 500 MG PO TABS: 500.0000 mg | ORAL_TABLET | Freq: Two times a day (BID) | ORAL | Status: DC

## 2013-03-02 NOTE — Progress Notes (Signed)
Sent abx for UTI to pharmacy.

## 2013-03-17 ENCOUNTER — Encounter (HOSPITAL_COMMUNITY): Payer: Self-pay

## 2013-03-17 ENCOUNTER — Telehealth: Payer: Self-pay | Admitting: *Deleted

## 2013-03-17 ENCOUNTER — Emergency Department (HOSPITAL_COMMUNITY)
Admission: EM | Admit: 2013-03-17 | Discharge: 2013-03-17 | Disposition: A | Discharge status: Home / Self Care | Payer: PRIVATE HEALTH INSURANCE | Attending: Emergency Medicine | Admitting: Emergency Medicine

## 2013-03-17 DIAGNOSIS — R42 Dizziness and giddiness: Secondary | ICD-10-CM | POA: Insufficient documentation

## 2013-03-17 DIAGNOSIS — Z8679 Personal history of other diseases of the circulatory system: Secondary | ICD-10-CM | POA: Insufficient documentation

## 2013-03-17 DIAGNOSIS — Z3202 Encounter for pregnancy test, result negative: Secondary | ICD-10-CM | POA: Insufficient documentation

## 2013-03-17 DIAGNOSIS — R079 Chest pain, unspecified: Secondary | ICD-10-CM | POA: Insufficient documentation

## 2013-03-17 DIAGNOSIS — E039 Hypothyroidism, unspecified: Secondary | ICD-10-CM | POA: Insufficient documentation

## 2013-03-17 DIAGNOSIS — R000 Tachycardia, unspecified: Secondary | ICD-10-CM | POA: Diagnosis present

## 2013-03-17 DIAGNOSIS — R110 Nausea: Secondary | ICD-10-CM | POA: Insufficient documentation

## 2013-03-17 DIAGNOSIS — J329 Chronic sinusitis, unspecified: Secondary | ICD-10-CM | POA: Diagnosis present

## 2013-03-17 DIAGNOSIS — Z79899 Other long term (current) drug therapy: Secondary | ICD-10-CM | POA: Insufficient documentation

## 2013-03-17 DIAGNOSIS — R002 Palpitations: Secondary | ICD-10-CM | POA: Insufficient documentation

## 2013-03-17 LAB — POCT I-STAT, CHEM 8
BUN: 16 mg/dL (ref 6–23)
HCT: 47 % — ABNORMAL HIGH (ref 36.0–46.0)
Sodium: 140 mEq/L (ref 135–145)
TCO2: 24 mmol/L (ref 0–100)

## 2013-03-17 LAB — BASIC METABOLIC PANEL
BUN: 16 mg/dL (ref 6–23)
CO2: 23 mEq/L (ref 19–32)
Chloride: 100 mEq/L (ref 96–112)
Creatinine, Ser: 0.77 mg/dL (ref 0.50–1.10)

## 2013-03-17 LAB — URINE MICROSCOPIC-ADD ON

## 2013-03-17 LAB — CBC
HCT: 42.4 % (ref 36.0–46.0)
MCV: 88 fL (ref 78.0–100.0)
RBC: 4.82 MIL/uL (ref 3.87–5.11)
WBC: 14.8 10*3/uL — ABNORMAL HIGH (ref 4.0–10.5)

## 2013-03-17 LAB — URINALYSIS, ROUTINE W REFLEX MICROSCOPIC
Bilirubin Urine: NEGATIVE
Glucose, UA: NEGATIVE mg/dL
Ketones, ur: NEGATIVE mg/dL
pH: 6.5 (ref 5.0–8.0)

## 2013-03-17 LAB — T3, FREE: T3, Free: 2.9 pg/mL (ref 2.3–4.2)

## 2013-03-17 LAB — POCT PREGNANCY, URINE: Preg Test, Ur: NEGATIVE

## 2013-03-17 LAB — T4, FREE: Free T4: 1.54 ng/dL (ref 0.80–1.80)

## 2013-03-17 MED ORDER — SODIUM CHLORIDE 0.9 % IV BOLUS (SEPSIS)
500.0000 mL | Freq: Once | INTRAVENOUS | Status: AC
  Administered 2013-03-17: 500 mL via INTRAVENOUS

## 2013-03-17 MED ORDER — AMOXICILLIN-POT CLAVULANATE 875-125 MG PO TABS: 1.0000 | ORAL_TABLET | Freq: Two times a day (BID) | ORAL | Status: AC

## 2013-03-17 NOTE — ED Notes (Signed)
Cardiology at bedside.

## 2013-03-17 NOTE — ED Notes (Signed)
Pt walked around room and hr started beating in 150-160's, dr Freida Busman called and at the bedside.

## 2013-03-17 NOTE — ED Notes (Signed)
Pt ambulated to restroom. 

## 2013-03-17 NOTE — ED Provider Notes (Signed)
History    CSN: [id number] Arrival date & time 03/17/13  0744  First MD Initiated Contact with Patient 03/17/13 0802     Chief Complaint  Patient presents with  . Near Syncope   (Consider location/radiation/quality/duration/timing/severity/associated sxs/prior Treatment) Patient is a 32 y.o. female presenting with palpitations. The history is provided by the patient.  Palpitations Palpitations quality:  Regular Onset quality:  Sudden Duration:  5 minutes Timing:  Constant Progression:  Improving Chronicity:  Recurrent (Started 2-3 days ago) Context: not appetite suppressants, not bronchodilators, not caffeine, not exercise, not illicit drugs, not nicotine and not stimulant use   Relieved by:  Valsalva (after 5 attempts today) Worsened by:  Nothing tried Ineffective treatments:  None tried Associated symptoms: chest pain and nausea   Associated symptoms: no back pain, no chest pressure, no cough, no diaphoresis, no dizziness, no hemoptysis, no leg pain, no lower extremity edema, no malaise/fatigue, no near-syncope, no numbness, no orthopnea, no PND, no shortness of breath, no syncope, no vomiting and no weakness   Chest pain:    Quality:  Unable to specify   Severity:  Unable to specify   Onset quality:  Sudden   Duration:  3 days   Timing:  Intermittent   Progression:  Improving   Chronicity:  New Risk factors: hx of thyroid disease (history of portpartum thyroiditis and currently taking Synthroid)   Risk factors: no diabetes mellitus, no heart disease, no hx of atrial fibrillation, no hx of DVT, no hx of PE and no hypercoagulable state    Past Medical History  Diagnosis Date  . Hypothyroid   . Mitral valve prolapse    Past Surgical History  Procedure Laterality Date  . Wisdom tooth extraction  2009   Family History  Problem Relation Age of Onset  . Colon cancer Paternal Grandmother     smoker  . Heart attack Maternal Grandfather   . Diabetes Paternal Grandmother    . Hyperlipidemia      grandparent.   . Hypertension      grandparents  . Heart attack Paternal Grandfather    History  Substance Use Topics  . Smoking status: Never Smoker   . Smokeless tobacco: Not on file  . Alcohol Use: Yes     Comment: Rare   OB History   Grav Para Term Preterm Abortions TAB SAB Ect Mult Living                 Review of Systems  Constitutional: Negative for fever, chills, malaise/fatigue, diaphoresis, activity change and appetite change.  HENT: Negative for sore throat, rhinorrhea, sneezing, drooling and trouble swallowing.   Eyes: Negative for discharge and redness.  Respiratory: Negative for cough, hemoptysis, chest tightness, shortness of breath, wheezing and stridor.   Cardiovascular: Positive for chest pain and palpitations. Negative for orthopnea, leg swelling, syncope, PND and near-syncope.  Gastrointestinal: Positive for nausea. Negative for vomiting, abdominal pain, diarrhea, constipation and blood in stool.  Genitourinary: Negative for difficulty urinating.  Musculoskeletal: Negative for myalgias, back pain and arthralgias.  Skin: Negative for pallor.  Neurological: Negative for dizziness, syncope, speech difficulty, weakness, light-headedness, numbness and headaches.  Hematological: Negative for adenopathy. Does not bruise/bleed easily.  Psychiatric/Behavioral: Negative for confusion and agitation.    Allergies  Review of patient's allergies indicates no known allergies.  Home Medications   Current Outpatient Rx  Name  Route  Sig  Dispense  Refill  . ciprofloxacin (CIPRO) 500 MG tablet   Oral  Take 1 tablet (500 mg total) by mouth 2 (two) times daily. For 3 days   6 tablet   0   . levothyroxine (SYNTHROID, LEVOTHROID) 88 MCG tablet   Oral   Take 1 tablet (88 mcg total) by mouth daily. Take 30-60 min before breakfast.   30 tablet   6   . Prenatal Vitamins (DIS) TABS   Oral   Take by mouth.            BP 136/83  Pulse 90   Temp(Src) 99 F (37.2 C) (Oral)  Resp 18  Ht 5\' 7"  (1.702 m)  Wt 140 lb (63.504 kg)  BMI 21.92 kg/m2  SpO2 99% Physical Exam  Constitutional: She is oriented to person, place, and time. She appears well-developed and well-nourished. No distress.  HENT:  Head: Normocephalic and atraumatic.  Right Ear: External ear normal.  Left Ear: External ear normal.  Eyes: Conjunctivae and EOM are normal. Right eye exhibits no discharge. Left eye exhibits no discharge.  Neck: Normal range of motion. Neck supple. No JVD present.  Cardiovascular: Regular rhythm, S1 normal, S2 normal and normal heart sounds.  Tachycardia present.  PMI is not displaced.  Exam reveals no gallop and no friction rub.   No murmur heard. HR initially 90's (NSR), and patient then became symptomatic after standing with increase of HR to 160's on telemetry  Pulmonary/Chest: Effort normal and breath sounds normal. No stridor. No respiratory distress. She has no wheezes. She has no rales. She exhibits no tenderness.  Abdominal: Soft. Bowel sounds are normal. She exhibits no distension. There is no tenderness. There is no rebound and no guarding.  Musculoskeletal: Normal range of motion. She exhibits no edema.  Neurological: She is alert and oriented to person, place, and time.  Skin: Skin is warm. No rash noted. She is not diaphoretic.  Psychiatric: She has a normal mood and affect. Her behavior is normal.    ED Course  Procedures (including critical care time) Labs Reviewed  GLUCOSE, CAPILLARY   No results found. No diagnosis found.   Date: 03/17/2013  Rate: 77  Rhythm: normal sinus rhythm  QRS Axis: normal  Intervals: normal  ST/T Wave abnormalities: normal  Conduction Disutrbances:none  Narrative Interpretation: Normal sinus rhythm   Old EKG Reviewed: unchanged   Date: 03/17/2013, repeated after patient became symptomatic with HR in 160's on tele  Rate: 137  Rhythm: sinus tachycardia  QRS Axis: normal   Intervals: normal  ST/T Wave abnormalities: repolarization abnormality  Conduction Disutrbances:none  Narrative Interpretation: Sinus tachycardia  Old EKG Reviewed: changes noted and now sinus tachycardia   MDM  Debra Singleton 31 y.o. presents with nausea, chest pain, lightheadedness, and tachycardia. Symptoms nearly resolved after patient performed a Valsalva maneuver 5 times. Initial EKG showed normal sinus rhythm. Initial workup for patient's symptoms to include thyroid function studies, urine pregnancy.  Patient also reportedly had UTI 2 weeks ago that was treated with Cipro. In the face of increasing resistance of the coli UA will be rechecked.  This reportedly attempted to get up out of bed and her heart rate increased to the 160s. Upon sitting back down in bed her heart rate improved to the 120s. Cardiology consult indicated for further management of the patient's tachycardia. Cardiology did not recommend any further workup at this time. Cardiology believes that this is likely secondary to a infectious process. Patient does have some symptoms of an upper respiratory process, including rhinorrhea, congestion, and postnasal drip. Considering his  symptoms been occurring for approximately 2 weeks, will treat for sinusitis with Augmentin. She is to follow up with her primary care doctor for further evaluation. She can return precautions including worsening symptoms, continuing elevated heart rate, or any other alarming or concerning symptoms or issues. Labs and EKG reviewed. I discussed this patient's care at my attending, Dr. Ethelene Browns.      Sena Hitch, MD 03/17/13 [phone]

## 2013-03-17 NOTE — ED Notes (Signed)
MD at bedside. 

## 2013-03-17 NOTE — Consult Note (Signed)
Cardiology Consult Note   Patient ID: Debra Singleton MRN: [id number], DOB/AGE: 1981-08-16   Admit date: 03/17/2013 Date of Consult: 03/17/2013  Primary Physician: Nani Gasser, MD Primary Cardiologist: B. Jens Som, MD  Reason for consult: palpitations  HPI: Debra Singleton is a 32 y.o. 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   She followed up with Dr. Jens Som in 01/2012 c/o new onset dyspnea on exertion, UE numbness and fatigue. She denied orthopnea, PND or pedal edema, and was noted to be euvolemic on exam. She denied chest pain or syncope. She had recently delivered her second child, and developed postpartum thyroiditis treated with Synthroid. EKG was noted to be NSR without arrhythmias or acute changes. D-dimer was checked and returned WNL. 2D echo 02/2012 revealed LVEF 55-60%, no MVP appreciated, no structural/valvular abnormalities.   She most recently followed up with her PCP in 02/27/13 and diagnosed with asymptomatic bacturia (grew E Coli, u/a unremarkable). She had also reported a concussion she sustained by hitting her head against a car door. Work up in [place] revealed an unremarkable MRI, BMP and CBC. TSH WNL.    She reports experiencing a sore throat, post nasal drainage and low grade fever (99.5) over the past 2.5 weeks. She reports associated nausea and decreased fluid intake. She denies vomiting or diarrhea. She has noticed episodes of tachy-palpitations, shortness of breath and fatigue for the past two days. She came to work today and experienced persistent tachy-palpitations aggravated by standing/walking from a seated position, shaking and chills. HR was noted to be in the 150-170s. She appeared pale to her colleagues. She attempted a Valsalva maneuver several times and HR finally reduced to 120s. She was advised to present to the ED. She  denies chest pain, PND, orthopnea, lightheadedness or syncope. Denies unilateral or bilateral LE edema. She denies increased stress or anxiety. Denies overt pain or bleeding. She denies EtOH, tobacco or illicit drug use. She denies OTC supplements or caffeine intake. She has not eaten today, but has had a propel water.   In the ED, EKG reveals sinus tachycardia, 137 bpm, diffuse upsloping ST depressions. CBC reveals a mild leukocytosis at 14.8K, polycythemia (Hgb 16/Hct 47). Ur preg test negative. CBG 79. HR 100-110 at rest, otherwise VSS. U/a reveals a small amount of leuk's otherwise unremarkable.   The patient was supervised and observed from a laying to seated position, then seated to standing. HR increased from 94 to 130 bpm on sitting, then to 153 on standing. She reported mild lightheadedness and dysuria on standing.   Problem List: Past Medical History  Diagnosis Date  . Hypothyroid   . Mitral valve prolapse     Past Surgical History  Procedure Laterality Date  . Wisdom tooth extraction  2009     Allergies: No Known Allergies  Home Medications: Prior to Admission medications   Medication Sig Start Date End Date Taking? Authorizing Provider  levothyroxine (SYNTHROID, LEVOTHROID) 88 MCG tablet Take 1 tablet (88 mcg total) by mouth daily. Take 30-60 min before breakfast. 02/27/13 02/27/14 Yes Jade L Breeback, PA-C  Multiple Vitamins-Minerals (MULTI-VITAMIN GUMMIES PO) Take 1 tablet by mouth daily.   Yes Historical Provider, MD    Inpatient Medications:    (Not in a hospital admission)  Family History  Problem Relation Age of Onset  . Colon cancer Paternal Grandmother     smoker  .  Heart attack Maternal Grandfather   . Diabetes Paternal Grandmother   . Hyperlipidemia      grandparent.   . Hypertension      grandparents  . Heart attack Paternal Grandfather      History   Social History  . Marital Status: Married    Spouse Name: N/A    Number of Children: 2  . Years  of Education: N/A   Occupational History  .      Registered Nurse   Social History Main Topics  . Smoking status: Never Smoker   . Smokeless tobacco: Not on file  . Alcohol Use: Yes     Comment: Rare  . Drug Use: No  . Sexually Active: Yes   Other Topics Concern  . Not on file   Social History Narrative   Exercises regularly.  Rare caffeine      Review of Systems: General: positive for chills, elevated temp, negative for night sweats or weight changes.  Cardiovascular: positive for palpitations, shortness of breath, negative for chest pain, dyspnea on exertion, edema, orthopnea, paroxysmal nocturnal dyspnea Dermatological: negative for rash Respiratory: negative for cough or wheezing Urologic: positive for increased urgency, negative for hematuria Abdominal: positive for nausea, negative for vomiting, diarrhea, bright red blood per rectum, melena, or hematemesis Neurologic: positive for lightheadedness, negative for visual changes, syncope All other systems reviewed and are otherwise negative except as noted above.  Physical Exam: Blood pressure 128/77, pulse 105, temperature 99.3 F (37.4 C), temperature source Oral, resp. rate 28, height 5\' 7"  (1.702 m), weight 63.504 kg (140 lb), SpO2 100.00%.    General: Well developed, well nourished, in no acute distress. Head: Normocephalic, atraumatic, sclera non-icteric, no xanthomas, nares are without discharge.  Neck: Negative for carotid bruits. JVD not elevated. Lungs: Clear bilaterally to auscultation without wheezes, rales, or rhonchi. Breathing is unlabored. Heart: Tachycardic, regular, with S1 S2. No murmurs, clicks, rubs, or gallops appreciated. Abdomen: Soft, non-tender, non-distended with normoactive bowel sounds. No hepatomegaly. No rebound/guarding. No obvious abdominal masses. Msk:  Strength and tone appears normal for age. Extremities: No clubbing, cyanosis or edema.  Distal pedal pulses are 2+ and equal  bilaterally. Neuro: Alert and oriented X 3. Moves all extremities spontaneously. Psych:  Responds to questions appropriately with a normal affect.  Labs: Recent Labs     03/17/13  0834  03/17/13  0853  WBC  14.8*   --   HGB  15.2*  16.0*  HCT  42.4  47.0*  MCV  88.0   --   PLT  366   --     Recent Labs Lab 03/17/13 0834 03/17/13 0853  NA 137 140  K 3.9 3.8  CL 100 103  CO2 23  --   BUN 16 16  CREATININE 0.77 0.80  CALCIUM 9.8  --   GLUCOSE 114* 115*   Radiology/Studies: None  EKG: Sinus tachycardia, 137 bpm, diffuse upsloping ST depressions, no TW changes  ASSESSMENT AND PLAN:   32 y.o. female with past medical history s/f reported MVP and postpartum thyroiditis who presented to [facility] ED today c/o palpitations.   1. Sinus tachycardia 2. Viral URI 3. UTI 4. Dehydration 5. Postpartum thyroiditis 6. ? POTS/autonomic-mediated tachycardia  The patient presents to [facility] with a two day history of tachy-palpitations with associated fatigue, lightheadedness, shortness of breath, chills and nausea within the context of a 2.5 week history of viral URI type symptoms in post nasal drainage, sore throat and congestion.  She has had decreased fluid intake during this time. She was recently treated for a UTI, but does endorse persistent urinary frequency. She denies chest pain or resting dyspnea. Objectively, she does appear dehydrated by evidence of hemoconcentration. Mild leukocytosis supports underlying infection. U/a does support a UTI. TSH was checked 5 weeks ago, and was normal per the patient. On my exam, the patient's HR increased > 30 bpm from supine to standing as noted in the HPI. This is likely secondary to dehydration and underlying infection. Aim to treat these underlying factors which may induce sinus tachycardia and palpitations. POTS is also on the differential but cannot be formally diagnosed at this time as secondary causes are evident. It has also not  been present for > 6 months. POTS/autonomic-mediated tachycardia can manifest after pregnancy and/or a viral illness. Autonomic symptoms in chills, nausea and pallor would support this. The patient does fit the common demographic for this condition (women of child-bearing age). Will check formal orthostatic VS to exclude orthostatic hypotension. Hydrate with IVF. Advise increased salt intake. Treat UTI empirically and add urine culture/sensitivities (grew E coli two weeks ago). Supportive care for viral URI. Continue Synthroid. ? Recheck TSH/T4. Overall low suspicion for PE. Should autonomic-mediated tachycardia persist beyond 6 months, consider adding fludricortisone or midodrine and formally diagnosing with POTS. She can be discharged from the ED after fluid hydration and plan follow-up with her PCP.     Signed, R. Hurman Horn, PA-C 03/17/2013, 10:34 AM  Attending Note:   The patient was seen and examined.  Agree with assessment and plan as noted above.  Changes made to the above note as needed.  Oney has had similar symptoms in the past.  Her symptoms today seem to be more related to an underlying viral illness ( elevated WBC, fever, volume depletion)  We will give her 1 liter of NS and see how she feels.  I have personally reviewed her echo and is essentially normal ( trace MR, TR).  I anticipate that she will be able to go home later today.   Vesta Mixer, Montez Hageman., MD, [facility] 03/17/2013, 11:20 AM

## 2013-03-17 NOTE — ED Notes (Addendum)
Pt here for "episode"upstairs when arriving to work, sts felt bad this morning and lightheaded, vs were checked up stairs and had a heart rate in 160-170's. Pt has not eaten this morning, felt clammy and shakey this morning. Has had a cold all summer but denies take otc meds for same.

## 2013-03-17 NOTE — ED Notes (Signed)
CARDIOLOGY AT BEDSIDE

## 2013-03-17 NOTE — Telephone Encounter (Signed)
Went to the ED this morning and is concerned because she was D/C'd and she doesn't feel comfortable about being sent home with no real direction. She stated that she asked if they would do a CXR and they did not.   I spoke with Dr. Linford Arnold about this and she stated that if she began to have any CP or SOB she will need to go directly to the ED to be seen. She will need to manually check her pulse and try to stay calm. I offered her an appt for tomorrow with Lesly Rubenstein she stated that she will call back in the morning if she wasn't feeling any better.Laureen Ochs, Viann Shove

## 2013-03-17 NOTE — ED Notes (Signed)
NAD NOTED.

## 2013-03-18 NOTE — ED Provider Notes (Signed)
I saw and evaluated the patient, reviewed the resident's note and I agree with the findings and plan.  Valarie Farace T Javarus Dorner, MD 03/18/13 0746 

## 2013-03-22 ENCOUNTER — Emergency Department (INDEPENDENT_AMBULATORY_CARE_PROVIDER_SITE_OTHER)
Admission: EM | Admit: 2013-03-22 | Discharge: 2013-03-22 | Disposition: A | Discharge status: Home / Self Care | Payer: PRIVATE HEALTH INSURANCE | Source: Home / Self Care | Attending: Family Medicine | Admitting: Family Medicine

## 2013-03-22 ENCOUNTER — Emergency Department (INDEPENDENT_AMBULATORY_CARE_PROVIDER_SITE_OTHER): Payer: PRIVATE HEALTH INSURANCE

## 2013-03-22 DIAGNOSIS — M940 Chondrocostal junction syndrome [Tietze]: Secondary | ICD-10-CM

## 2013-03-22 DIAGNOSIS — R0789 Other chest pain: Secondary | ICD-10-CM

## 2013-03-22 DIAGNOSIS — R0602 Shortness of breath: Secondary | ICD-10-CM

## 2013-03-22 LAB — POCT CBC W AUTO DIFF (K'VILLE URGENT CARE)

## 2013-03-22 NOTE — ED Notes (Signed)
States she was seen in ED on Tues d/t elevated HR(172) and now having SHOB and unable to take deep breath

## 2013-03-22 NOTE — ED Provider Notes (Signed)
History    CSN: [id number] Arrival date & time 03/22/13  1331  First MD Initiated Contact with Patient 03/22/13 1403     Chief Complaint  Patient presents with  . Shortness of Breath      HPI Comments: Patient states that she developed a URI on June 20 that subsequently resolved.  About 6 days ago she developed tightness in her anterior chest with shortness of breath and intermittent tachycardia.  She presented to the [facility] ER where EKG was normal except for sinus tachycardia. Her white blood count at that time was 14.8.   She was thought to have an infectious process and treated with Augmentin. She now reports that she has had no further palpitations or fever, but she continues to have a sensation of tightness and burning in her anterior chest and even feels shortness of breath at times.  She states that she has had five episodes of pneumonia in the past, with last episode in August 2013.  She reports that she has had no cough associated with her present chest tightness.  The history is provided by the patient.   Past Medical History  Diagnosis Date  . Hypothyroid   . Mitral valve prolapse    Past Surgical History  Procedure Laterality Date  . Wisdom tooth extraction  2009   Family History  Problem Relation Age of Onset  . Colon cancer Paternal Grandmother     smoker  . Heart attack Maternal Grandfather   . Diabetes Paternal Grandmother   . Hyperlipidemia      grandparent.   . Hypertension      grandparents  . Heart attack Paternal Grandfather    History  Substance Use Topics  . Smoking status: Never Smoker   . Smokeless tobacco: Not on file  . Alcohol Use: Yes     Comment: Rare   OB History   Grav Para Term Preterm Abortions TAB SAB Ect Mult Living                 Review of Systems No sore throat No cough No pleuritic pain, but has persistent tightness in anterior chest No wheezing No nasal congestion No post-nasal drainage No sinus  pain/pressure No itchy/red eyes No earache No hemoptysis + SOB No fever/chills No nausea No vomiting No abdominal pain No diarrhea No urinary symptoms No skin rashes + fatigue No myalgias + headache    Allergies  Review of patient's allergies indicates no known allergies.  Home Medications   Current Outpatient Rx  Name  Route  Sig  Dispense  Refill  . amoxicillin-clavulanate (AUGMENTIN) 875-125 MG per tablet   Oral   Take 1 tablet by mouth every 12 (twelve) hours.   14 tablet   0   . levothyroxine (SYNTHROID, LEVOTHROID) 88 MCG tablet   Oral   Take 1 tablet (88 mcg total) by mouth daily. Take 30-60 min before breakfast.   30 tablet   6   . Multiple Vitamins-Minerals (MULTI-VITAMIN GUMMIES PO)   Oral   Take 1 tablet by mouth daily.          BP 96/62  Pulse 75  Temp(Src) 98.1 F (36.7 C) (Oral)  Ht 5\' 7"  (1.702 m)  Wt 139 lb 8 oz (63.277 kg)  BMI 21.84 kg/m2  SpO2 100%  LMP 03/19/2013 Physical Exam Nursing notes and Vital Signs reviewed. Appearance:  Patient appears healthy, stated age, and in no acute distress Eyes:  Pupils are equal, round,  and reactive to light and accomodation.  Extraocular movement is intact.  Conjunctivae are not inflamed  Ears:  Canals normal.  Tympanic membranes normal.  Nose:   Normal turbinates.  No sinus tenderness.    Pharynx:  Normal Neck:  Supple.  Tender shotty posterior nodes are palpated bilaterally  Lungs:  Clear to auscultation.  Breath sounds are equal.  Chest:  Distinct tenderness to palpation over the mid-sternum.  Heart:  Regular rate and rhythm without murmurs, rubs, or gallops.  Abdomen:  Nontender without masses or hepatosplenomegaly.  Bowel sounds are present.  No CVA or flank tenderness.  Extremities:  No edema.  No calf tenderness Skin:  No rash present.   ED Course  Procedures  None   Labs Reviewed  POCT CBC W AUTO DIFF ([facility]) WBC 7.0; LY 28.2; MO 9.1; GR 62.7; Hgb 14.3; Platelets 384     Dg Chest 2 View  03/22/2013   *RADIOLOGY REPORT*  Clinical Data: Shortness of breath.  Anterior chest tightness. Mitral valve prolapse.  CHEST - 2 VIEW  Comparison:  None.  Findings:  The heart size and mediastinal contours are within normal limits.  Both lungs are clear.  The visualized skeletal structures are unremarkable.  IMPRESSION: No active cardiopulmonary disease.   Original Report Authenticated By: Myles Rosenthal, M.D.   1. Costochondritis; normal chest x-ray and WBC reassuring     MDM  Finish Augmentin Take Ibuprofen 200mg , 4 tabs every 8 hours with food, or Aleve two tabs twice daily with food.  Apply heating pad to sternum two or three times daily. Followup with Family Doctor if not improved in one week.   Lattie Haw, MD 03/22/13 [phone]

## 2013-03-24 ENCOUNTER — Telehealth: Payer: Self-pay | Admitting: Emergency Medicine

## 2013-06-22 ENCOUNTER — Encounter: Payer: Self-pay | Admitting: Family Medicine

## 2013-06-22 ENCOUNTER — Ambulatory Visit (INDEPENDENT_AMBULATORY_CARE_PROVIDER_SITE_OTHER): Payer: PRIVATE HEALTH INSURANCE | Admitting: Family Medicine

## 2013-06-22 VITALS — BP 128/80 | HR 100 | Temp 98.8°F | Wt 143.0 lb

## 2013-06-22 DIAGNOSIS — R000 Tachycardia, unspecified: Secondary | ICD-10-CM

## 2013-06-22 DIAGNOSIS — R259 Unspecified abnormal involuntary movements: Secondary | ICD-10-CM

## 2013-06-22 DIAGNOSIS — R251 Tremor, unspecified: Secondary | ICD-10-CM

## 2013-06-22 DIAGNOSIS — R110 Nausea: Secondary | ICD-10-CM

## 2013-06-22 DIAGNOSIS — R0602 Shortness of breath: Secondary | ICD-10-CM

## 2013-06-22 MED ORDER — ONDANSETRON HCL 4 MG PO TABS: 4.0000 mg | ORAL_TABLET | Freq: Three times a day (TID) | ORAL | Status: DC | PRN

## 2013-06-22 NOTE — Progress Notes (Signed)
CC: Debra Singleton is a 32 y.o. female is here for feeling jittery   Subjective: HPI:  Patient implants of restlessness, nausea, rapid heartbeat, tremor, fatigue and subjective fever that has been present for the past 3-4 days. Has been accompanied by mild dizziness all other symptoms above described as moderate in severity worse first thing in the morning nothing particularly makes better or worse otherwise. Not related to dietary habits, she's cut out all caffeine in her diet without improvement of symptoms. Rapid heartbeat is present at rest estimated pulse 100-110 resting she believes increases up to 1:30-140 with casual ambulation, when this occurs she reports subjective shortness of breath. She denies any recent or remote cough, wheezing, chest pain. Nausea seems persistent throughout the day slightly improved with meclizine, she denies abdominal pain, flank pain, back pain, nor dysphagia.   Review Of Systems Outlined In HPI  Past Medical History  Diagnosis Date  . Hypothyroid   . Mitral valve prolapse      Family History  Problem Relation Age of Onset  . Colon cancer Paternal Grandmother     smoker  . Heart attack Maternal Grandfather   . Diabetes Paternal Grandmother   . Hyperlipidemia      grandparent.   . Hypertension      grandparents  . Heart attack Paternal Grandfather      History  Substance Use Topics  . Smoking status: Never Smoker   . Smokeless tobacco: Not on file  . Alcohol Use: Yes     Comment: Rare     Objective: Filed Vitals:   06/22/13 1057  BP: 128/80  Pulse:   Temp:     General: Alert and Oriented, No Acute Distress HEENT: Pupils equal, round, reactive to light. Conjunctivae clear.  External ears unremarkable, canals clear with intact TMs with appropriate landmarks.  Middle ear appears open without effusion. Pink inferior turbinates.  Moist mucous membranes, pharynx without inflammation nor lesions.  Neck supple without palpable lymphadenopathy  nor abnormal masses. Lungs: Clear to auscultation bilaterally, no wheezing/ronchi/rales.  Comfortable work of breathing. Good air movement. Cardiac: Mild tachycardia with regular rhythm Normal S1/S2.  No murmurs, rubs, nor gallops.   Extremities: No peripheral edema.  Strong peripheral pulses.  Neuro: L4 and S1 DTRs 3 / 4 bilaterally Mental Status: No depression, nor agitation. Mildly anxious Skin: Warm and dry.  Assessment & Plan: Chava was seen today for feeling jittery.  Diagnoses and associated orders for this visit:  Tachycardia - CBC w/Diff - BASIC METABOLIC PANEL WITH GFR - TSH  Shortness of breath - CBC w/Diff - BASIC METABOLIC PANEL WITH GFR - TSH  Nausea alone  Tremor  Other Orders - ondansetron (ZOFRAN) 4 MG tablet; Take 1 tablet (4 mg total) by mouth every 8 (eight) hours as needed for nausea.    Agree with her stopping levothyroxine until TSH returns tomorrow We'll screen for anemia, and a metabolic/electrolyte abnormalities with the above, ultimate plan will be determined on results of above  25 minutes spent face-to-face during visit today of which at least 50% was counseling or coordinating care regarding tachycardia, shortness of breath, nausea, tremor.   Return if symptoms worsen or fail to improve.

## 2013-06-23 ENCOUNTER — Encounter: Payer: Self-pay | Admitting: Family Medicine

## 2013-06-23 ENCOUNTER — Ambulatory Visit (INDEPENDENT_AMBULATORY_CARE_PROVIDER_SITE_OTHER): Payer: PRIVATE HEALTH INSURANCE

## 2013-06-23 ENCOUNTER — Ambulatory Visit (INDEPENDENT_AMBULATORY_CARE_PROVIDER_SITE_OTHER): Payer: PRIVATE HEALTH INSURANCE | Admitting: Family Medicine

## 2013-06-23 ENCOUNTER — Telehealth: Payer: Self-pay | Admitting: Family Medicine

## 2013-06-23 VITALS — BP 150/95 | HR 116 | Wt 141.0 lb

## 2013-06-23 DIAGNOSIS — R51 Headache: Secondary | ICD-10-CM

## 2013-06-23 DIAGNOSIS — R000 Tachycardia, unspecified: Secondary | ICD-10-CM

## 2013-06-23 LAB — CBC WITH DIFFERENTIAL/PLATELET
Basophils Relative: 0 % (ref 0–1)
Eosinophils Absolute: 0.1 10*3/uL (ref 0.0–0.7)
Eosinophils Relative: 1 % (ref 0–5)
HCT: 44.4 % (ref 36.0–46.0)
Lymphocytes Relative: 21 % (ref 12–46)
Lymphs Abs: 1.6 10*3/uL (ref 0.7–4.0)
MCV: 88.6 fL (ref 78.0–100.0)
Monocytes Absolute: 0.4 10*3/uL (ref 0.1–1.0)
Neutro Abs: 5.3 10*3/uL (ref 1.7–7.7)
Platelets: 338 10*3/uL (ref 150–400)
RBC: 5.01 MIL/uL (ref 3.87–5.11)
RDW: 13.7 % (ref 11.5–15.5)
WBC: 7.5 10*3/uL (ref 4.0–10.5)

## 2013-06-23 LAB — D-DIMER, QUANTITATIVE: D-Dimer, Quant: 0.27 ug/mL-FEU (ref 0.00–0.48)

## 2013-06-23 LAB — BASIC METABOLIC PANEL WITH GFR
CO2: 27 mEq/L (ref 19–32)
Calcium: 10 mg/dL (ref 8.4–10.5)
Chloride: 102 mEq/L (ref 96–112)
Creat: 0.79 mg/dL (ref 0.50–1.10)
GFR, Est Non African American: >89 mL/min
Potassium: 4.4 mEq/L (ref 3.5–5.3)
Sodium: 139 mEq/L (ref 135–145)

## 2013-06-23 LAB — TSH: TSH: 1.455 u[IU]/mL (ref 0.350–4.500)

## 2013-06-23 MED ORDER — AMOXICILLIN-POT CLAVULANATE 500-125 MG PO TABS: ORAL_TABLET | ORAL | Status: DC

## 2013-06-23 MED ORDER — KETOROLAC TROMETHAMINE 60 MG/2ML IM SOLN
60.0000 mg | Freq: Once | INTRAMUSCULAR | Status: AC
  Administered 2013-06-23: 60 mg via INTRAMUSCULAR

## 2013-06-23 MED ORDER — PROPRANOLOL HCL 20 MG PO TABS: 20.0000 mg | ORAL_TABLET | Freq: Three times a day (TID) | ORAL | Status: DC

## 2013-06-23 MED ORDER — TRAMADOL HCL 50 MG PO TABS: 50.0000 mg | ORAL_TABLET | Freq: Three times a day (TID) | ORAL | Status: DC | PRN

## 2013-06-23 NOTE — Telephone Encounter (Signed)
Pt notified of recommendations. She has an appt with you this morning at 11:15.

## 2013-06-23 NOTE — Progress Notes (Signed)
CC: Debra Singleton is a 32 y.o. female is here for Dizziness and Nausea   Subjective: HPI:  Accompanied by husband  Patient complains of continued rapid heartbeat, headache, restlessness and tremors. Symptoms again are worse first thing in the morning slightly improved as the day progresses. Heart rate is worsened when sitting up or exerting herself. Headache is localized to the crown radiates to the forehead bilateral nothing particularly makes better or worse. Tremor only noticed in the hands occurs when sleeping in the first morning hours of being awake. She believes symptoms have gotten worse since yesterday and overall have been present for the last week. They were initially accompanied by subjective fevers for the first 1-2 days. Thinking back she believes some of her symptoms began soon after hitting her head after standing up quickly and striking the edge of a cabinet door. Symptoms improved with rest however returned after doing a kickboxing class last week.  Review of systems is positive for dizziness and occasional nausea. Currently she denies fevers, chills, hearing loss, tinnitus,roaring  Sensation in either ear, motor sensory disturbances, vision loss, confusion, chest pain, shortness of breath, wheezing, cough, nasal congestion, sinus pressure, postnasal drip, rashes, muscle or joint pain. She has tried increase fluids in her diet she has not added salt to her diet.   Review Of Systems Outlined In HPI  Past Medical History  Diagnosis Date  . Hypothyroid   . Mitral valve prolapse      Family History  Problem Relation Age of Onset  . Colon cancer Paternal Grandmother     smoker  . Heart attack Maternal Grandfather   . Diabetes Paternal Grandmother   . Hyperlipidemia      grandparent.   . Hypertension      grandparents  . Heart attack Paternal Grandfather      History  Substance Use Topics  . Smoking status: Never Smoker   . Smokeless tobacco: Not on file  . Alcohol  Use: Yes     Comment: Rare     Objective: Filed Vitals:   06/23/13 1142  BP: 150/95  Pulse: 116    General: Alert and Oriented, No Acute Distress HEENT: Pupils equal, round, reactive to light. Conjunctivae clear.  External ears unremarkable, canals clear with intact TMs with appropriate landmarks.  Middle ear appears open without effusion. Pink inferior turbinates.  Moist mucous membranes, pharynx without inflammation nor lesions.  Neck supple without palpable lymphadenopathy nor abnormal masses. Lungs: Clear to auscultation bilaterally, no wheezing/ronchi/rales.  Comfortable work of breathing. Good air movement. Cardiac: Regular rate and rhythm. Normal S1/S2.  No murmurs, rubs, nor gallops.  No carotid bruits Neuro: CN II-XII grossly intact, full strength/rom of all four extremities, gait normal, rapid alternating movements normal Extremities: No peripheral edema.  Strong peripheral pulses.  Mental Status: No depression, nor agitation, moderately anxious Skin: Warm and dry.  Assessment & Plan: Debra Singleton was seen today for dizziness and nausea.  Diagnoses and associated orders for this visit:  Headache(784.0) - CT Head Wo Contrast; Future - ketorolac (TORADOL) injection 60 mg; Inject 2 mLs (60 mg total) into the muscle once.  Tachycardia - propranolol (INDERAL) 20 MG tablet; Take 1 tablet (20 mg total) by mouth 3 (three) times daily. - D-dimer, quantitative - PR ELECTROCARDIOGRAM, COMPLETE    Encourage patient to increase salt in diet and increase fluids also to follow up with cardiology for consideration of POTS, patient politely declines. EKG was obtained to further evaluate for tachycardia, EKG says normal  sinus rhythm, normal axis, normal intervals, normal QRS, no pathological Q waves, there is a T-wave inversion in V1 otherwise no ST segment elevation or depression. Looking at a tracing from July 2014 T-wave inversion in V1 was also present.  Encouraged patient to have a  d-dimer to rule out pulmonary embolus causing tachycardia. She is quite anxious about her headache, we discussed possible imaging in the future however she would like [place] to be done as soon as possible. Discussed this will help rule out hemorrhage and considerable mass,  She would like to do this today. This was performed today showing no abnormality. For her headache she was given Toradol 60 mg today, she initially declined tramadol however a phone note later requested prescription, will start on propranolol.  We'll continue to encourage cardiology referral if not improving.  40 minutes spent face-to-face during visit today of which at least 50% was counseling or coordinating care regarding headache, tachycardia.    Return if symptoms worsen or fail to improve.

## 2013-06-23 NOTE — Telephone Encounter (Signed)
(  see result note) In the rare chance this is a sinus infection I'll call in an Rx for augmentin, I'm still suspicious of postural orthostatic tachycardia syndrome and would still encourage increasing salt in the diet and fluids.  If not improving by Thursday would recommend cardiology referral or second opinion with PCP follow up visit.

## 2013-06-23 NOTE — Telephone Encounter (Signed)
Pt notified and she does want [place] to send over rx for tramadol but she also wanted to know if a migraine medication can be sent to pharm...Marland KitchenMarland Kitchen

## 2013-06-23 NOTE — Addendum Note (Signed)
Addended by: Laren Boom on: 06/23/2013 03:01 PM   Modules accepted: Orders

## 2013-06-23 NOTE — Telephone Encounter (Signed)
Pt states Dr. mentioned about putting her on Tramadol as well but script was not called into pharmacy.

## 2013-06-23 NOTE — Telephone Encounter (Signed)
No PA required for CT Head w/o contrast per [facility] in imaging.  Meyer Cory, LPN

## 2013-06-23 NOTE — Telephone Encounter (Signed)
Debra Singleton, Patient expressed reservations about tramadol due to her fear it could be habit forming, if she's now interested in my offer there's an Rx in your inbox ready for pickup/fax.

## 2013-06-23 NOTE — Telephone Encounter (Addendum)
Debra Singleton, I think an Rx for tramadol was put in your inbox this afternoon, if not grab me as I may have left it on the printer, this would still be my recommendation for her headache if propranolol is not helping.  Her headache does not meet criteria for a migraine which is why I would not recommend any medications uses purely for migraines.

## 2013-06-24 ENCOUNTER — Encounter: Payer: Self-pay | Admitting: Family Medicine

## 2013-06-24 ENCOUNTER — Telehealth: Payer: Self-pay | Admitting: *Deleted

## 2013-06-24 ENCOUNTER — Telehealth: Payer: Self-pay | Admitting: Family Medicine

## 2013-06-24 DIAGNOSIS — R000 Tachycardia, unspecified: Secondary | ICD-10-CM

## 2013-06-24 NOTE — Telephone Encounter (Signed)
Referral placed.

## 2013-06-24 NOTE — Telephone Encounter (Signed)
Pt.notified

## 2013-06-24 NOTE — Telephone Encounter (Signed)
In response to pt's emails please see phone encounters and results encounters. Will close [employer]

## 2013-06-25 ENCOUNTER — Telehealth: Payer: Self-pay | Admitting: *Deleted

## 2013-06-25 DIAGNOSIS — R000 Tachycardia, unspecified: Secondary | ICD-10-CM

## 2013-06-30 ENCOUNTER — Telehealth: Payer: Self-pay | Admitting: *Deleted

## 2013-06-30 ENCOUNTER — Encounter: Payer: Self-pay | Admitting: Family Medicine

## 2013-06-30 DIAGNOSIS — R51 Headache: Secondary | ICD-10-CM

## 2013-06-30 NOTE — Telephone Encounter (Signed)
Pt called today & left a vm stating that she saw cariology & they advised her that she needed a referral from you for neurology & not cardiology.

## 2013-07-01 NOTE — Telephone Encounter (Signed)
Pt.notified

## 2013-07-01 NOTE — Telephone Encounter (Signed)
Neurology referral placed, would again encourage f/u with PCP.

## 2013-07-02 ENCOUNTER — Ambulatory Visit (INDEPENDENT_AMBULATORY_CARE_PROVIDER_SITE_OTHER): Payer: PRIVATE HEALTH INSURANCE | Admitting: Family Medicine

## 2013-07-02 ENCOUNTER — Encounter: Payer: Self-pay | Admitting: Family Medicine

## 2013-07-02 ENCOUNTER — Telehealth: Payer: Self-pay | Admitting: *Deleted

## 2013-07-02 VITALS — BP 116/73 | HR 83 | Temp 98.2°F | Wt 142.0 lb

## 2013-07-02 DIAGNOSIS — R110 Nausea: Secondary | ICD-10-CM

## 2013-07-02 DIAGNOSIS — R51 Headache: Secondary | ICD-10-CM

## 2013-07-02 DIAGNOSIS — F0781 Postconcussional syndrome: Secondary | ICD-10-CM

## 2013-07-02 MED ORDER — PROMETHAZINE HCL 25 MG PO TABS: 12.5000 mg | ORAL_TABLET | Freq: Four times a day (QID) | ORAL | Status: DC | PRN

## 2013-07-02 MED ORDER — TRAMADOL HCL 50 MG PO TABS: 50.0000 mg | ORAL_TABLET | Freq: Three times a day (TID) | ORAL | Status: DC | PRN

## 2013-07-02 NOTE — Patient Instructions (Signed)
Concussion and Brain Injury  A blow or jolt to the head can disrupt the normal function of the brain. This type of brain injury is often called a "concussion" or a "closed head injury." Concussions are usually not life-threatening. Even so, the effects of a concussion can be serious.   CAUSES   A concussion is caused by a blunt blow to the head. The blow might be direct or indirect as described below.  · Direct blow (running into another player during a soccer game, being hit in a fight, or hitting your head on a hard surface).  · Indirect blow (when your head moves rapidly and violently back and forth like in a car crash).  SYMPTOMS   The brain is very complex. Every head injury is different. Some symptoms may appear right away. Other symptoms may not show up for days or weeks after the concussion. The signs of concussion can be hard to notice. Early on, problems may be missed by patients, family members, and caregivers. You may look fine even though you are acting or feeling differently.   These symptoms are usually temporary, but may last for days, weeks, or even longer. Symptoms include:  · Mild headaches that will not go away.  · Having more trouble than usual with:  · Remembering things.  · Paying attention or concentrating.  · Organizing daily tasks.  · Making decisions and solving problems.  · Slowness in thinking, acting, speaking, or reading.  · Getting lost or easily confused.  · Feeling tired all the time or lacking energy (fatigue).  · Feeling drowsy.  · Sleep disturbances.  · Sleeping more than usual.  · Sleeping less than usual.  · Trouble falling asleep.  · Trouble sleeping (insomnia).  · Loss of balance or feeling lightheaded or dizzy.  · Nausea or vomiting.  · Numbness or tingling.  · Increased sensitivity to:  · Sounds.  · Lights.  · Distractions.  Other symptoms might include:  · Vision problems or eyes that tire easily.  · Diminished sense of taste or smell.  · Ringing in the ears.  · Mood  changes such as feeling sad, anxious, or listless.  · Becoming easily irritated or angry for little or no reason.  · Lack of motivation.  DIAGNOSIS   Your caregiver can usually diagnose a concussion or mild brain injury based on your description of your injury and your symptoms.   Your evaluation might include:  · A brain scan to look for signs of injury to the brain. Even if the test shows no injury, you may still have a concussion.  · Blood tests to be sure other problems are not present.  TREATMENT   · People with a concussion need to be examined and evaluated. Most people with concussions are treated in an emergency department, urgent care, or clinic. Some people must stay in the hospital overnight for further treatment.  · Your caregiver will send you home with important instructions to follow. Be sure to carefully follow them.  · Tell your caregiver if you are already taking any medicines (prescription, over-the-counter, or natural remedies), or if you are drinking alcohol or taking illegal drugs. Also, talk with your caregiver if you are taking blood thinners (anticoagulants) or aspirin. These drugs may increase your chances of complications. All of this is important information that may affect treatment.  · Only take over-the-counter or prescription medicines for pain, discomfort, or fever as directed by your caregiver.  PROGNOSIS     How fast people recover from brain injury varies from person to person. Although most people have a good recovery, how quickly they improve depends on many factors. These factors include how severe their concussion was, what part of the brain was injured, their age, and how healthy they were before the concussion.   Because all head injuries are different, so is recovery. Most people with mild injuries recover fully. Recovery can take time. In general, recovery is slower in older persons. Also, persons who have had a concussion in the past or have other medical problems may find  that it takes longer to recover from their current injury. Anxiety and depression may also make it harder to adjust to the symptoms of brain injury.  HOME CARE INSTRUCTIONS   Return to your normal activities slowly, not all at once. You must give your body and brain enough time for recovery.  · Get plenty of sleep at night, and rest during the day. Rest helps the brain to heal.  · Avoid staying up late at night.  · Keep the same bedtime hours on weekends and weekdays.  · Take daytime naps or rest breaks when you feel tired.  · Limit activities that require a lot of thought or concentration (brain or cognitive rest). This includes:  · Homework or job-related work.  · Watching TV.  · Computer work.  · Avoid activities that could lead to a second brain injury, such as contact or recreational sports, until your caregiver says it is okay. Even after your brain injury has healed, you should protect yourself from having another concussion.  · Ask your caregiver when you can return to your normal activities such as driving, bicycling, or operating heavy equipment. Your ability to react may be slower after a brain injury.  · Talk with your caregiver about when you can return to work or school.  · Inform your teachers, school nurse, school counselor, coach, athletic trainer, or work manager about your injury, symptoms, and restrictions. They should be instructed to report:  · Increased problems with attention or concentration.  · Increased problems remembering or learning new information.  · Increased time needed to complete tasks or assignments.  · Increased irritability or decreased ability to cope with stress.  · Increased symptoms.  · Take only those medicines that your caregiver has approved.  · Do not drink alcohol until your caregiver says you are well enough to do so. Alcohol and certain other drugs may slow your recovery and can put you at risk of further injury.  · If it is harder than usual to remember things,  write them down.  · If you are easily distracted, try to do one thing at a time. For example, do not try to watch TV while fixing dinner.  · Talk with family members or close friends when making important decisions.  · Keep all follow-up appointments. Repeated evaluation of your symptoms is recommended for your recovery.  PREVENTION   Protect your head from future injury. It is very important to avoid another head or brain injury before you have recovered. In rare cases, another injury has lead to permanent brain damage, brain swelling, or death. Avoid injuries by using:  · Seatbelts when riding in a car.  · Alcohol only in moderation.  · A helmet when biking, skiing, skateboarding, skating, or doing similar activities.  · Safety measures in your home.  · Remove clutter and tripping hazards from floors and stairways.  · Use grab   bars in bathrooms and handrails by stairs.  · Place non-slip mats on floors and in bathtubs.  · Improve lighting in dim areas.  SEEK MEDICAL CARE IF:   A head injury can cause lingering symptoms. You should seek medical care if you have any of the following symptoms for more than 3 weeks after your injury or are planning to return to sports:  · Chronic headaches.  · Dizziness or balance problems.  · Nausea.  · Vision problems.  · Increased sensitivity to noise or light.  · Depression or mood swings.  · Anxiety or irritability.  · Memory problems.  · Difficulty concentrating or paying attention.  · Sleep problems.  · Feeling tired all the time.  SEEK IMMEDIATE MEDICAL CARE IF:   You have had a blow or jolt to the head and you (or your family or friends) notice:  · Severe or worsening headaches.  · Weakness (even if only in one hand or one leg or one part of the face), numbness, or decreased coordination.  · Repeated vomiting.  · Increased sleepiness or passing out.  · One black center of the eye (pupil) is larger than the other.  · Convulsions (seizures).  · Slurred speech.  · Increasing  confusion, restlessness, agitation, or irritability.  · Lack of ability to recognize people or places.  · Neck pain.  · Difficulty being awakened.  · Unusual behavior changes.  · Loss of consciousness.  Older adults with a brain injury may have a higher risk of serious complications such as a blood clot on the brain. Headaches that get worse or an increase in confusion are signs of this complication. If these signs occur, see a caregiver right away.  MAKE SURE YOU:   · Understand these instructions.  · Will watch your condition.  · Will get help right away if you are not doing well or get worse.  FOR MORE INFORMATION   Several groups help people with brain injury and their families. They provide information and put people in touch with local resources. These include support groups, rehabilitation services, and a variety of health care professionals. Among these groups, the Brain Injury Association (BIA, www.biausa.org) has a national office that gathers scientific and educational information and works on a national level to help people with brain injury.   Document Released: 11/17/2003 Document Revised: 11/19/2011 Document Reviewed: 04/14/2008  Exit[employer]® Patient Information ©2014 [employer].

## 2013-07-02 NOTE — Progress Notes (Addendum)
Subjective:    Patient ID: Debra Singleton, female    DOB: October 20, 1980, 32 y.o.   MRN: [id number]  HPI Had concussion on May 31st and went to ED and then saw Neuro. Had neg Head CT.  She then hit her head again on 05/14/13 on a cabinet and had HA and symptoms for about a 1 week. It was better for bout 10 days and the tried to start exercising again and would feel worse. The on Monday 06/15/13 in the evening and then had a HA all day on teh 10/7 at work.  Had her flu shot around that time as well. Then felt really weak, shakey, heart racing.  She has seen cards at [facility] and they did an echo yesterday and is wearing a heart monitor. She feels really nauseated and no appetite and still has a HA. Says her coordination is better.  Still feels weak and shakey.  Gets more nauseted with inc stimulation. Couldn't pick up the zofran bc was 90$. Sys the tramadol has helped some.  Restarted her neurontin about 2 nights agin. Using IBU in the AM and using Tylenol PM at night.  Encouraged her to take neurontin about 1 hour before bedtime.  She did try to go out to dinner last night and to try to make herself take walks and felt worse. Back she started getting such significant headache and neck pain last night this year Ms. which the emergency room. She is getting a lot of tension in her neck muscles as well.  She has been out of work since 06/17/2013.   Had normal CBC, CMP and thyroid about a week ago. No fevers.  Review of Systems BP 116/73  Pulse 83  Temp(Src) 98.2 F (36.8 C)  Wt 142 lb (64.411 kg)  BMI 22.24 kg/m2    No Known Allergies  Past Medical History  Diagnosis Date  . Hypothyroid   . Mitral valve prolapse     Past Surgical History  Procedure Laterality Date  . Wisdom tooth extraction  2009    History   Social History  . Marital Status: Married    Spouse Name: N/A    Number of Children: 2  . Years of Education: N/A   Occupational History  .      Registered Nurse   Social History  Main Topics  . Smoking status: Never Smoker   . Smokeless tobacco: Not on file  . Alcohol Use: Yes     Comment: Rare  . Drug Use: No  . Sexual Activity: Yes   Other Topics Concern  . Not on file   Social History Narrative   Exercises regularly.  Rare caffeine     Family History  Problem Relation Age of Onset  . Colon cancer Paternal Grandmother     smoker  . Heart attack Maternal Grandfather   . Diabetes Paternal Grandmother   . Hyperlipidemia      grandparent.   . Hypertension      grandparents  . Heart attack Paternal Grandfather     Outpatient Encounter Prescriptions as of 07/02/2013  Medication Sig Dispense Refill  . gabapentin (NEURONTIN) 100 MG capsule Take 100-300 mg by mouth at bedtime.      Marland Kitchen levothyroxine (SYNTHROID, LEVOTHROID) 88 MCG tablet Take 1 tablet (88 mcg total) by mouth daily. Take 30-60 min before breakfast.  30 tablet  6  . Multiple Vitamins-Minerals (MULTI-VITAMIN GUMMIES PO) Take 1 tablet by mouth daily.      . ondansetron (  ZOFRAN) 4 MG tablet Take 1 tablet (4 mg total) by mouth every 8 (eight) hours as needed for nausea.  30 tablet  1  . promethazine (PHENERGAN) 25 MG tablet Take 0.5-1 tablets (12.5-25 mg total) by mouth every 6 (six) hours as needed for nausea.  15 tablet  0  . traMADol (ULTRAM) 50 MG tablet Take 1 tablet (50 mg total) by mouth every 8 (eight) hours as needed for pain.  30 tablet  0  . [DISCONTINUED] propranolol (INDERAL) 20 MG tablet Take 1 tablet (20 mg total) by mouth 3 (three) times daily.  90 tablet  1  . [DISCONTINUED] traMADol (ULTRAM) 50 MG tablet Take 1 tablet (50 mg total) by mouth every 8 (eight) hours as needed for pain.  30 tablet  0   No facility-administered encounter medications on file as of 07/02/2013.          Objective:   Physical Exam  Vitals reviewed. Constitutional: She is oriented to person, place, and time. She appears well-developed and well-nourished.  HENT:  Head: Normocephalic and atraumatic.   Eyes: Conjunctivae and EOM are normal. Pupils are equal, round, and reactive to light.  Cardiovascular: Normal rate, regular rhythm and normal heart sounds.   Pulmonary/Chest: Effort normal and breath sounds normal.  Musculoskeletal:  Strength in upper and lower extremities is symmetric and normal.  Neurological: She is alert and oriented to person, place, and time. She has normal reflexes.  Alert and oriented.  CN 2-12 intact.   Normal rapid alternating movements of hands. Reflexes symmetric in the UE and LE.  Normal knee to ankle, down the shin bilaterally.  No tremor.  She seemed very uncomfortable today. Most of the visit she actually had her eyes closed because she said the sound and lights were making her feel more comfortable with her headache.    Skin: Skin is warm and dry.  Psychiatric: She has a normal mood and affect. Her behavior is normal.          Assessment & Plan:  Post concussive syndrome - Increase neurontin to 300mg  at bedtime. Can increase to TID but she says it make her dizzy.  She did restart her Neurontin at bedtime about 2 nights ago. Encouraged her to increase to 300 mg. If she tolerates this well after couple days then increase to one extra tablet in the morning and 1 at bedtime. If after a few day she's still feeling well with it she can increase to one the morning, when the afternoon and 3 at bedtime. This should help with her headaches especially at nighttime. She can continue to use ibuprofen, tramadol and Tylenol PM but want her to use them sparingly as this can start to cause a rebound phenomenon with her headaches. I stressed repeatedly to her the importance of mental rest. She is to not watch TV, and no radio, no tach seeing or phone, no reading, in no getting on the computer. The more she is able to rest mentally work or she will recover. Peripheral history then placed for neurology we will get her in as soon as we are able to. I explained her that with a normal  CT scan and normal neurologic exam that the main treatment for post concussive syndrome is rest. I did refill her tramadol today as she does feel like it has been helpful. More so than ibuprofen or Tylenol.  Tachycardia-currently I think this is more stress related. Her cardiologist to leave things as well but she  did have an echo yesterday. She's not her the final results back on that. She is wearing a cardiac monitor today and will hopefully get the results back, and the next week or 2.  Nausea-I think this is directly related to the post concussive syndrome-she was unable to afford the Zofran. He was approximately $90. I will send her a prescription for Phenergan. Sometimes is a little but she for a hopefully will be covered. It can be sedating such as warned her about that. That she may want to start with half a tab as needed. She can certainly also try over-the-counter meclizine, nondrowsy Dramamine and see if this helps with her symptoms as well.  Time spent 40 minutes, greater than 50% spent counseling about her postconcussive syndrome and tachycardia and nausea.

## 2013-07-02 NOTE — Telephone Encounter (Signed)
lvm informing pt's husband of Dr. Shelah Lewandowsky recommendations for pain meds:  IBU 400mg  in the morning IF pain is 4/10 or greater Tramadol if pain is 6/10 or greater Can use Tylenol PM if headache is 6/10 or greater.Debra Singleton

## 2013-07-03 ENCOUNTER — Telehealth: Payer: Self-pay | Admitting: *Deleted

## 2013-07-03 ENCOUNTER — Ambulatory Visit: Payer: PRIVATE HEALTH INSURANCE | Admitting: Cardiovascular Disease

## 2013-07-03 NOTE — Telephone Encounter (Signed)
Called and lvm informing pt's husband that her FMLA forms are ready however she or he will need to complete the rest of the form.Laureen Ochs, Viann Shove

## 2013-07-06 ENCOUNTER — Ambulatory Visit: Payer: PRIVATE HEALTH INSURANCE | Admitting: Physician Assistant

## 2013-07-08 ENCOUNTER — Telehealth: Payer: Self-pay | Admitting: *Deleted

## 2013-07-08 NOTE — Telephone Encounter (Signed)
fmla paperwork faxed.Loralee Pacas [place]

## 2013-07-08 NOTE — Telephone Encounter (Signed)
Pt's forms faxed.Loralee Pacas [place]

## 2013-07-16 ENCOUNTER — Other Ambulatory Visit: Payer: Self-pay

## 2013-09-17 ENCOUNTER — Encounter: Payer: Self-pay | Admitting: Family Medicine

## 2013-09-17 MED ORDER — LEVOTHYROXINE SODIUM 88 MCG PO TABS: 88.0000 ug | ORAL_TABLET | Freq: Every day | ORAL | Status: DC

## 2013-10-09 LAB — BASIC METABOLIC PANEL
BUN: 10 mg/dL (ref 4–21)
CREATININE: 1 mg/dL (ref 0.5–1.1)
Glucose: 113 mg/dL
POTASSIUM: 3.7 mmol/L (ref 3.4–5.3)
SODIUM: 141 mmol/L (ref 137–147)

## 2013-10-09 LAB — HEPATIC FUNCTION PANEL
ALT: 12 U/L (ref 7–35)
AST: 16 U/L (ref 13–35)
Alkaline Phosphatase: 67 U/L (ref 25–125)
Bilirubin, Total: 1.6 mg/dL

## 2013-10-09 LAB — TSH: TSH: 0.92 u[IU]/mL (ref 0.41–5.90)

## 2013-10-12 ENCOUNTER — Encounter: Payer: Self-pay | Admitting: Family Medicine

## 2013-10-12 ENCOUNTER — Ambulatory Visit (INDEPENDENT_AMBULATORY_CARE_PROVIDER_SITE_OTHER): Payer: 59 | Admitting: Family Medicine

## 2013-10-12 VITALS — BP 130/69 | HR 80 | Temp 97.9°F | Ht 68.0 in | Wt 143.0 lb

## 2013-10-12 DIAGNOSIS — F0781 Postconcussional syndrome: Secondary | ICD-10-CM

## 2013-10-12 DIAGNOSIS — R319 Hematuria, unspecified: Secondary | ICD-10-CM

## 2013-10-12 DIAGNOSIS — R110 Nausea: Secondary | ICD-10-CM

## 2013-10-12 DIAGNOSIS — E039 Hypothyroidism, unspecified: Secondary | ICD-10-CM

## 2013-10-12 DIAGNOSIS — M549 Dorsalgia, unspecified: Secondary | ICD-10-CM

## 2013-10-12 LAB — POCT URINALYSIS DIPSTICK
Bilirubin, UA: NEGATIVE
GLUCOSE UA: NEGATIVE
KETONES UA: NEGATIVE
LEUKOCYTES UA: NEGATIVE
Nitrite, UA: NEGATIVE
Protein, UA: NEGATIVE
Urobilinogen, UA: 0.2
pH, UA: 6

## 2013-10-12 MED ORDER — OMEPRAZOLE 20 MG PO CPDR: 20.0000 mg | DELAYED_RELEASE_CAPSULE | Freq: Every day | ORAL | Status: DC

## 2013-10-12 NOTE — Progress Notes (Signed)
   Subjective:    Patient ID: Debra Singleton, female    DOB: 12-27-1980, 33 y.o.   MRN: [id number]  [facility] on January 5 to followup for postconcussive syndrome. She has had 2 abnormal EEGs.  They decided to wean her Topamax because of side effects and put her on Valium instead. The topamax was helping her with her HA and sharp head pains but got numbness in her face and her hands.  She is concerned that this taper is affecting her thyroid. She currently takes levothyroxine 88 mcg daily. Her last thyroid check was in October in look fantastic. She has also had pyelnephritis in November,  since I last saw her and was on ABX for 21 days.   Lab Results  Component Value Date   TSH 1.455 06/22/2013   Hypothyroid - she read that the topomax can cause peirod to be irregular and she was 2 weeks late but read it can affect her thyroid as well.  She has been getting palpation.   She has been having back pain for 2 weeks. Comes and goes in waves and having nausea as well.  Getting some burning in the epigastric area. No trauma or injury. She was also dx with a kidney stone.  No worsening or alleviating factors. She denies any hematuria or dysuria. No fevers chills or sweats.  Review of Systems     Objective:   Physical Exam  Constitutional: She is oriented to person, place, and time. She appears well-developed and well-nourished.  HENT:  Head: Normocephalic and atraumatic.  Cardiovascular: Normal rate, regular rhythm and normal heart sounds.   Pulmonary/Chest: Effort normal and breath sounds normal.  Abdominal: Soft. Bowel sounds are normal. She exhibits no distension and no mass. There is no tenderness. There is no rebound and no guarding.  Musculoskeletal:  No CVA tenderness.  Neurological: She is alert and oriented to person, place, and time.  Skin: Skin is warm and dry.  Psychiatric: She has a normal mood and affect. Her behavior is normal.          Assessment & Plan:   Hypothyroid - Will recheck TSH when off the topamax for 2 weeks. She should be off by Friday.  They're weaning her down this week. I recommended not making any adjustments to her level right now I think it may go back to its usual level around 1.4 1.5 when she stopped the medication.  Back pain - Will check UA aith her recent hx of pyelonephritis.  The pain does seem to come and go and she has days where doesn't bother her. Her urinalysis did show some trace blood so we'll send it for microscopic as well as a culture.  Nausea - suspect GERD - Recommend trial of PPI or zantac.  Will send over Rx for omeprazole. Call if not helping after one week. Again could be related to a urinary tract infection so will check urinalysis today as well.  Postconcussive syndrome-continue and follow up with neurology. They are considering Lamictal in place of the Topamax. She does have a lot of concerns about this such as Stevens-Johnson syndrome is a possibility.

## 2013-10-13 ENCOUNTER — Telehealth: Payer: Self-pay | Admitting: *Deleted

## 2013-10-13 ENCOUNTER — Other Ambulatory Visit: Payer: Self-pay | Admitting: *Deleted

## 2013-10-13 LAB — URINALYSIS, ROUTINE W REFLEX MICROSCOPIC
Bilirubin Urine: NEGATIVE
GLUCOSE, UA: NEGATIVE mg/dL
Hgb urine dipstick: NEGATIVE
KETONES UR: NEGATIVE mg/dL
LEUKOCYTES UA: NEGATIVE
Nitrite: NEGATIVE
PROTEIN: NEGATIVE mg/dL
Specific Gravity, Urine: 1.01 (ref 1.005–1.030)
Urobilinogen, UA: 0.2 mg/dL (ref 0.0–1.0)
pH: 5.5 (ref 5.0–8.0)

## 2013-10-13 NOTE — Telephone Encounter (Signed)
Pt called and wanted to know if the results of her Ucx was back and I told her that those results were not in at this time. She reports that she is having pain and nausea and has taken 1000 mg of tylenol around 12 pm and heating pad for comfort and she is not feeling any better. She has not had a fever only back pain. She would like a referral to urology since she has had Hx of UTI's

## 2013-10-14 LAB — URINE CULTURE: Colony Count: 2000

## 2013-10-14 NOTE — Telephone Encounter (Signed)
Ok urine culture is negative is sent in result note.

## 2013-10-14 NOTE — Telephone Encounter (Signed)
Pt called this morning asking for her results from the [facility] I informed her that dr. Madilyn Fireman was not here today however I will check to see if another provider could look at this and call her back. Pt voiced understanding and agreed.Debra Singleton

## 2013-10-14 NOTE — Telephone Encounter (Signed)
Pt called and given results.Debra Singleton

## 2013-10-20 ENCOUNTER — Telehealth: Payer: Self-pay | Admitting: *Deleted

## 2013-10-20 LAB — TSH: TSH: 1.959 u[IU]/mL (ref 0.350–4.500)

## 2013-10-20 NOTE — Telephone Encounter (Signed)
Pt wanted to know if an order for a TSH was placed. Order was faxed pt to go at her leisure.Debra Singleton [place]

## 2013-11-02 ENCOUNTER — Ambulatory Visit (INDEPENDENT_AMBULATORY_CARE_PROVIDER_SITE_OTHER): Payer: 59 | Admitting: Family Medicine

## 2013-11-02 ENCOUNTER — Encounter: Payer: Self-pay | Admitting: Family Medicine

## 2013-11-02 VITALS — BP 121/70 | HR 81 | Temp 97.9°F | Wt 144.0 lb

## 2013-11-02 DIAGNOSIS — F0781 Postconcussional syndrome: Secondary | ICD-10-CM

## 2013-11-02 DIAGNOSIS — E039 Hypothyroidism, unspecified: Secondary | ICD-10-CM

## 2013-11-02 NOTE — Patient Instructions (Signed)
Go to the lab around March 10th for your thyroid.

## 2013-11-02 NOTE — Progress Notes (Signed)
   Subjective:    Patient ID: Debra Singleton, female    DOB: 1980-12-23, 33 y.o.   MRN: [id number]  HPI Now on Vimpat for post concussive syndrome.  Her nausea is better.  Her back pain is better too.  She is now on amitriptyline at bedtime. She denies feeling depressed but does occasionally feel sad about her situation. She's been unable to work and take care of her children like she would like. She has been tearful but says she has a very strong faith and that has kept her going. Her husband has also been very supportive. Lab Results  Component Value Date   TSH 1.959 10/20/2013   Was taking her thyroid medication every 3 days instead of daily bc noticed her heart was having palpitations when she was taking it daily.  Heart was running in the low 100s.  She is now taking it daily and not having palpitations.    Review of Systems     Objective:   Physical Exam  Constitutional: She appears well-developed and well-nourished.  HENT:  Head: Normocephalic and atraumatic.  Neck: No thyromegaly present.  Skin: Skin is warm and dry.  Psychiatric: She has a normal mood and affect. Her behavior is normal.          Assessment & Plan:  Hypothyroid - will monitor thyroid more carefully while on her current medications if they can affect thyroid levels. She just adjusted her medication to daily dosing about 11 days ago. Encouraged her to wait 2-3 more weeks before we recheck the level. After that we will need to check every 2-3 months while she's on her current medication regimen. She is feeling better and has not had as many palpitations which is reassuring.  Post concussive syndrome-has followup later next month with neurology. She's doing well on her new regimen. She's also on amitriptyline at bedtime which seems to be helping as well.

## 2013-11-19 ENCOUNTER — Telehealth: Payer: Self-pay | Admitting: Family Medicine

## 2013-11-19 DIAGNOSIS — S060XAA Concussion with loss of consciousness status unknown, initial encounter: Secondary | ICD-10-CM

## 2013-11-19 DIAGNOSIS — S060X9A Concussion with loss of consciousness of unspecified duration, initial encounter: Secondary | ICD-10-CM

## 2013-11-19 LAB — TSH: TSH: 0.588 u[IU]/mL (ref 0.350–4.500)

## 2013-11-19 NOTE — Telephone Encounter (Signed)
Referral placed.Debra Singleton  

## 2013-11-19 NOTE — Telephone Encounter (Signed)
Patient has a follow up visit with [facility](seen initially for headache)on 3/26 and  would need a new referral to be submitted.  Thank you.

## 2013-11-25 ENCOUNTER — Encounter: Payer: Self-pay | Admitting: *Deleted

## 2013-12-02 ENCOUNTER — Telehealth: Payer: Self-pay | Admitting: *Deleted

## 2013-12-02 DIAGNOSIS — E039 Hypothyroidism, unspecified: Secondary | ICD-10-CM

## 2013-12-02 NOTE — Telephone Encounter (Signed)
Pt called and would like to have her thyroid rechecked. She stated that she doesn't feel right she has been having palpitations and has not taken her meds in 2 days because of the way she feels she should have her thyroid checked. Order placed.Maryruth Eve, L[place] Crocker

## 2014-01-01 ENCOUNTER — Ambulatory Visit (INDEPENDENT_AMBULATORY_CARE_PROVIDER_SITE_OTHER): Payer: 59 | Admitting: Family Medicine

## 2014-01-01 ENCOUNTER — Encounter: Payer: Self-pay | Admitting: Family Medicine

## 2014-01-01 VITALS — BP 122/83 | HR 97 | Temp 98.1°F | Wt 148.0 lb

## 2014-01-01 DIAGNOSIS — R599 Enlarged lymph nodes, unspecified: Secondary | ICD-10-CM

## 2014-01-01 DIAGNOSIS — R591 Generalized enlarged lymph nodes: Secondary | ICD-10-CM

## 2014-01-01 NOTE — Progress Notes (Signed)
   Subjective:    Patient ID: Debra Singleton, female    DOB: 1981-04-06, 33 y.o.   MRN: [id number]  HPI Here to be evaluated for swollen lymph node about 1 week.  Feels bigger. No URI or sores on the scalp. .  She finished up Bactrim for her UTI, last friday. Was seen a [facility] again over the weekend and given 7 more day so of bactrim.  Says the LN was tender initially but better.  Says her ears have been flushing. She does feel like she had a low-grade temperature on Tuesday, approximately 99.9.  Review of Systems     Objective:   Physical Exam  Constitutional: She is oriented to person, place, and time. She appears well-developed and well-nourished.  HENT:  Head: Normocephalic and atraumatic.  Right Ear: External ear normal.  Left Ear: External ear normal.  Nose: Nose normal.  Mouth/Throat: Oropharynx is clear and moist.  TMs and canals are clear.   Eyes: Conjunctivae and EOM are normal. Pupils are equal, round, and reactive to light.  Neck: Neck supple. No thyromegaly present.    3 swollen lymph nodes. The one on the right side of the neck for approximately between a half and 1 cm in size. One behind the left ear is approximately 1 cm in size. They're firm but smooth. Not rockhard. One behind the left ear is a little bit irregular.  Cardiovascular: Normal rate, regular rhythm and normal heart sounds.   Pulmonary/Chest: Effort normal and breath sounds normal. She has no wheezes.  Lymphadenopathy:    She has no cervical adenopathy.  Neurological: She is alert and oriented to person, place, and time.  Skin: Skin is warm and dry.  Psychiatric: She has a normal mood and affect. Her behavior is normal.          Assessment & Plan:  Reactive lymph nodes.  - will recheck CBC today.  We'll compare a CBC performed last weekend at [facility]. Colitis see her back in 3 weeks to recheck the lymph nodes and see if they're resolving on their own. They completely go away then she can  cancel the appointment. Call if any recurrent fevers in the meantime.

## 2014-01-02 LAB — CBC WITH DIFFERENTIAL/PLATELET
Basophils Absolute: 0.1 10*3/uL (ref 0.0–0.1)
Basophils Relative: 1 % (ref 0–1)
EOS ABS: 0.4 10*3/uL (ref 0.0–0.7)
EOS PCT: 7 % — AB (ref 0–5)
HCT: 39.1 % (ref 36.0–46.0)
HEMOGLOBIN: 14 g/dL (ref 12.0–15.0)
LYMPHS ABS: 1.6 10*3/uL (ref 0.7–4.0)
Lymphocytes Relative: 26 % (ref 12–46)
MCH: 31.9 pg (ref 26.0–34.0)
MCHC: 35.8 g/dL (ref 30.0–36.0)
MCV: 89.1 fL (ref 78.0–100.0)
MONOS PCT: 9 % (ref 3–12)
Monocytes Absolute: 0.5 10*3/uL (ref 0.1–1.0)
Neutro Abs: 3.5 10*3/uL (ref 1.7–7.7)
Neutrophils Relative %: 57 % (ref 43–77)
Platelets: 233 10*3/uL (ref 150–400)
RBC: 4.39 MIL/uL (ref 3.87–5.11)
RDW: 12.9 % (ref 11.5–15.5)
WBC: 6.1 10*3/uL (ref 4.0–10.5)

## 2014-01-14 ENCOUNTER — Ambulatory Visit (INDEPENDENT_AMBULATORY_CARE_PROVIDER_SITE_OTHER): Payer: 59 | Admitting: Family Medicine

## 2014-01-14 ENCOUNTER — Encounter: Payer: Self-pay | Admitting: Family Medicine

## 2014-01-14 VITALS — BP 145/100 | HR 70 | Wt 148.0 lb

## 2014-01-14 DIAGNOSIS — R0781 Pleurodynia: Secondary | ICD-10-CM

## 2014-01-14 DIAGNOSIS — R079 Chest pain, unspecified: Secondary | ICD-10-CM

## 2014-01-14 DIAGNOSIS — R599 Enlarged lymph nodes, unspecified: Secondary | ICD-10-CM

## 2014-01-14 DIAGNOSIS — R590 Localized enlarged lymph nodes: Secondary | ICD-10-CM

## 2014-01-14 DIAGNOSIS — R42 Dizziness and giddiness: Secondary | ICD-10-CM

## 2014-01-14 DIAGNOSIS — R0789 Other chest pain: Secondary | ICD-10-CM

## 2014-01-14 NOTE — Patient Instructions (Signed)
Try to eat a salty snack in the evening.

## 2014-01-14 NOTE — Progress Notes (Signed)
Subjective:    Patient ID: Debra Singleton, female    DOB: [birth year], 33 y.o.   MRN: [id number]  HPI About 5 days ago has been getting a dull ache on the left lower anterior rib area.  Worse with walking.  Now spreading to the right and towards the left axilla.  Says halfway through eating will get nauseated.  No trauma.  Has felt lighteaded when changes position as well. Will feel like can' t take a deep breath when this happens. No ST, nasal congestion, post nasal drip or itching.  No fever.  Ears start to get really hot in the evenings and will turn bright red.  No heartburn or refluxe.   F/u swollen LN. no fevers chills or sweats. She actually feels like a lymph node behind her left ear has gotten a little smaller. She feels no change in the lymph nodes on the right side of her neck. No sore throat. No ear pain.   Review of Systems  BP 145/100  Pulse 70  Wt 148 lb (67.132 kg)  SpO2 100%    No Known Allergies  Past Medical History  Diagnosis Date  . Hypothyroid   . Mitral valve prolapse     Past Surgical History  Procedure Laterality Date  . Wisdom tooth extraction  2009    History   Social History  . Marital Status: Married    Spouse Name: N/A    Number of Children: 2  . Years of Education: N/A   Occupational History  .      Registered Nurse   Social History Main Topics  . Smoking status: Never Smoker   . Smokeless tobacco: Not on file  . Alcohol Use: Yes     Comment: Rare  . Drug Use: No  . Sexual Activity: Yes   Other Topics Concern  . Not on file   Social History Narrative   Exercises regularly.  Rare caffeine     Family History  Problem Relation Age of Onset  . Colon cancer Paternal Grandmother     smoker  . Heart attack Maternal Grandfather   . Diabetes Paternal Grandmother   . Hyperlipidemia      grandparent.   . Hypertension      grandparents  . Heart attack Paternal Grandfather     Outpatient Encounter Prescriptions as of 01/14/2014   Medication Sig  . AMITRIPTYLINE HCL PO Take by mouth.  . diazepam (VALIUM) 5 MG tablet Take 2.5 mg by mouth at bedtime as needed for anxiety.  Marland Kitchen levothyroxine (SYNTHROID, LEVOTHROID) 88 MCG tablet Take 1 tablet (88 mcg total) by mouth daily before breakfast. Take 30-60 min before breakfast.  . Multiple Vitamins-Minerals (MULTI-VITAMIN GUMMIES PO) Take 1 tablet by mouth daily.  . [DISCONTINUED] gabapentin (NEURONTIN) 100 MG capsule Take 100 mg by mouth at bedtime.           Objective:   Physical Exam  Constitutional: She is oriented to person, place, and time. She appears well-developed and well-nourished.  HENT:  Head: Normocephalic and atraumatic.  Right Ear: External ear normal.  Left Ear: External ear normal.  Nose: Nose normal.  Mouth/Throat: Oropharynx is clear and moist.  TMs and canals are clear.   Eyes: Conjunctivae and EOM are normal. Pupils are equal, round, and reactive to light.  Neck: Neck supple. No thyromegaly present.  Lymph node on the left ear was actually difficult to find this time. Less than half a centimeter. Still able to palpate the  2 lymph nodes on the right side along the sternomastoid. In my opinion much smaller than previous soft. They're not hard or indurated. No irregularity.  Cardiovascular: Normal rate, regular rhythm and normal heart sounds.   Pulmonary/Chest: Effort normal and breath sounds normal. She has no wheezes.  Lymphadenopathy:    She has cervical adenopathy.  Neurological: She is alert and oriented to person, place, and time.  Skin: Skin is warm and dry.  Psychiatric: She has a normal mood and affect.          Assessment & Plan:  LUQ pain/left lower rib pain-unclear etiology at this point. She is nontender over the abdomen itself. I think pancreatitis or gallbladder is very low on the differential. She's not very tender over the ribs themselves are without any trauma or injury, fracture is low for suspicion. She's not having any  shortness of breath per se. She does feel like she gets a momentary shortness of breath when she stands up too quickly. I think this could easily be a PVC or skipped beat that is causing that sensation. She does have a history of mitral valve prolapse and in fact was on a beta blocker for a period time to help control her symptoms. I encouraged her to consider retrying the propranolol since her heart rate has been running in the 90s at times. Her lung exam is completely normal and her pulse ox is completely normal. Gave her reassurance. I do not think a chest x-ray is warranted at this time. I did repeat an EKG since her pain is over the left lower ribs and radiating up to the left axilla. Her rate was 90 beats per minute, normal sinus rhythm with normal axis. No acute ST-T wave changes. No change from previous done in October 2014.  Lightheaded- she does not appear to be orthostatic today and has been trying to hydrate well. Recommend a small salty snack in the evenings and see if this helps with how she's feeling. Also consider restarting the propranolol to help control pulse and heart rate. I think this could help as well. Also encouraged her to take her time in changing positions especially she transitions back to work tomorrow.  LN have improved and CBC was normal. To me this is very reassuring and try to give her reassurance. Do not need to followup unless they persist or suddenly gets larger or become rock hard. Recommend  Recommend followup in one month.

## 2014-01-18 ENCOUNTER — Telehealth: Payer: Self-pay | Admitting: *Deleted

## 2014-01-21 ENCOUNTER — Encounter: Payer: Self-pay | Admitting: *Deleted

## 2014-01-27 NOTE — Telephone Encounter (Signed)
Referral for endo pt would like to think about this after speaking with her.Teddy Spike

## 2014-02-17 ENCOUNTER — Telehealth: Payer: Self-pay | Admitting: *Deleted

## 2014-02-17 DIAGNOSIS — E079 Disorder of thyroid, unspecified: Secondary | ICD-10-CM

## 2014-02-17 DIAGNOSIS — O99285 Endocrine, nutritional and metabolic diseases complicating the puerperium: Principal | ICD-10-CM

## 2014-02-17 NOTE — Telephone Encounter (Signed)
This thyroid level is the normal range of 0.35 to 4.5. I don't recommend a change at this time.

## 2014-02-17 NOTE — Telephone Encounter (Signed)
She is concerned about her symptoms of missing periods, increased urination, dry skin, weight loss and increased heart rate. She does not want to continue her current dose. She has not been taking it for a few days. She has requested an Endocrinology referral. Please advise.

## 2014-02-17 NOTE — Telephone Encounter (Signed)
I am not going to place a referral. I don't think it's appropriate. She certainly welcome to come and discuss her symptoms with me. But some of her symptoms if they are related to her thyroid don't  make sense as some of them are consistent with hyperthyroid such as heart rate and weight loss and some of her symptoms are consistent with hypothyroid such as dry skin. There may be something besides her thyroid going on. We could certainly recheck her blood level and do a full thyroid panel  to confirm the accuracy. But her last thyroid level III months ago was at 0.5 and if it's at 0.6 right now then that  is a great number.

## 2014-02-17 NOTE — Telephone Encounter (Signed)
Pt reports that she has been weaning off of the medications that her neurologist has been prescribing her and this is something that they agreed on and she stated that she has been feeling really weak and her hair is falling out [facility] to [facility] and her tsh level was 0.671 and she wanted to know if Dr. Madilyn Fireman would like to change her dosage?  Please advise.Audelia Hives [place]

## 2014-02-19 LAB — HEPATIC FUNCTION PANEL
ALT: 14 U/L (ref 7–35)
AST: 18 U/L (ref 13–35)

## 2014-02-19 LAB — TSH: TSH: 3.11 u[IU]/mL (ref 0.41–5.90)

## 2014-02-19 LAB — BASIC METABOLIC PANEL
BUN: 17 mg/dL (ref 4–21)
Creatinine: 0.8 mg/dL (ref 0.5–1.1)
POTASSIUM: 4.3 mmol/L (ref 3.4–5.3)
Sodium: 141 mmol/L (ref 137–147)

## 2014-02-19 LAB — CBC AND DIFFERENTIAL
Hemoglobin: 14.5 g/dL (ref 12.0–16.0)
Platelets: 296 10*3/uL (ref 150–399)

## 2014-02-22 ENCOUNTER — Encounter: Payer: Self-pay | Admitting: Family Medicine

## 2014-02-22 ENCOUNTER — Ambulatory Visit (INDEPENDENT_AMBULATORY_CARE_PROVIDER_SITE_OTHER): Payer: 59 | Admitting: Family Medicine

## 2014-02-22 VITALS — BP 114/76 | HR 101 | Wt 146.0 lb

## 2014-02-22 DIAGNOSIS — R002 Palpitations: Secondary | ICD-10-CM

## 2014-02-22 DIAGNOSIS — E039 Hypothyroidism, unspecified: Secondary | ICD-10-CM

## 2014-02-22 DIAGNOSIS — R51 Headache: Secondary | ICD-10-CM

## 2014-02-22 MED ORDER — LEVOTHYROXINE SODIUM 50 MCG PO TABS: 50.0000 ug | ORAL_TABLET | Freq: Every day | ORAL | Status: DC

## 2014-02-22 MED ORDER — KETOROLAC TROMETHAMINE 60 MG/2ML IM SOLN
60.0000 mg | Freq: Once | INTRAMUSCULAR | Status: AC
  Administered 2014-02-22: 60 mg via INTRAMUSCULAR

## 2014-02-22 NOTE — Patient Instructions (Signed)
I decreased her thyroid medication to 50 mcg daily. Continue take 30-60 minutes before breakfast on an empty stomach for consistent absorption. Recommend he go to the lab in about 3 weeks to recheck your levels. We can adjust her dose at that time.

## 2014-02-22 NOTE — Telephone Encounter (Signed)
Dr. Gardiner Ramus patient's husband walked-in and is requesting for patient to get referral to Endocrinologist and states it wont hurt and they state that her situation is not a normal case and would like to be referred please. And there is not a certain one but one who can get her in the soonest prefer's Jule Ser but will go anywhere. Thanks

## 2014-02-22 NOTE — Progress Notes (Signed)
Subjective:    Patient ID: Debra Singleton, female    DOB: 1981/03/18, 33 y.o.   MRN: [id number]  HPI Was having some palpitatinos and weakness and felt really bad.  They recheck her thyroid and it was around 0.6.  So she cut back her dose on the medicaiton.  Then went to ED at the end of the week and was told TSH was 3.1. She had held her thyroid mediction for a week. She had lost some weight and says has had some hair loss.  Stopped her amitryptiline last week but did wean it over a month.  Her husband was out of town last week and that was stressful for her.  Feels like all the cell in her body is moving and vibrating.  She feels very strongly that a lot of her symptoms are related to her thyroid not being well regulated and wants to know if she would benefit from an endocrinology referral.  Has been really nauseated as well as decreased appetite. She plans on calling the neurologist to restart her gabapentin.   She also complains now of a pressure feeling in her head. She's worried that some of her symptoms might be coming back from her concussion. She just wants to finally get well and did well. She became very tearful in the office today.  Review of Systems  BP 114/76  Pulse 101  Wt 146 lb (66.225 kg)    No Known Allergies  Past Medical History  Diagnosis Date  . Hypothyroid   . Mitral valve prolapse     Past Surgical History  Procedure Laterality Date  . Wisdom tooth extraction  2009    History   Social History  . Marital Status: Married    Spouse Name: N/A    Number of Children: 2  . Years of Education: N/A   Occupational History  .      Registered Nurse   Social History Main Topics  . Smoking status: Never Smoker   . Smokeless tobacco: Not on file  . Alcohol Use: Yes     Comment: Rare  . Drug Use: No  . Sexual Activity: Yes   Other Topics Concern  . Not on file   Social History Narrative   Exercises regularly.  Rare caffeine     Family History   Problem Relation Age of Onset  . Colon cancer Paternal Grandmother     smoker  . Heart attack Maternal Grandfather   . Diabetes Paternal Grandmother   . Hyperlipidemia      grandparent.   . Hypertension      grandparents  . Heart attack Paternal Grandfather     Outpatient Encounter Prescriptions as of 02/22/2014  Medication Sig  . diazepam (VALIUM) 5 MG tablet Take 2.5 mg by mouth at bedtime as needed for anxiety.  Marland Kitchen levothyroxine (SYNTHROID, LEVOTHROID) 50 MCG tablet Take 1 tablet (50 mcg total) by mouth daily before breakfast. Take 30-60 min before breakfast.  . Multiple Vitamins-Minerals (MULTI-VITAMIN GUMMIES PO) Take 1 tablet by mouth daily.  . [DISCONTINUED] levothyroxine (SYNTHROID, LEVOTHROID) 88 MCG tablet Take 1 tablet (88 mcg total) by mouth daily before breakfast. Take 30-60 min before breakfast.  . [DISCONTINUED] AMITRIPTYLINE HCL PO Take by mouth.  . [EXPIRED] ketorolac (TORADOL) injection 60 mg           Objective:   Physical Exam  Constitutional: She is oriented to person, place, and time. She appears well-developed and well-nourished.  HENT:  Head: Normocephalic  and atraumatic.  Neck: Neck supple. No thyromegaly present.  Cardiovascular: Normal rate, regular rhythm and normal heart sounds.   Pulmonary/Chest: Effort normal and breath sounds normal.  Lymphadenopathy:    She has no cervical adenopathy.  Neurological: She is alert and oriented to person, place, and time.  Skin: Skin is warm and dry.  Psychiatric: She has a normal mood and affect. Her behavior is normal.          Assessment & Plan:  Head pressure- will give tramadol for acute relief.  Encouraged discuss with neurology. She may need to restart her gabapentin. Of several of the drug that she's tried for her headaches that one worked well with the least amount of side effects. He stopped the amitriptyline because it was causing orthostasis.  Hypothyroidism with a history of postpartum  thyroiditis-encouraged her to stick with the regimen so that we can recheck her thyroid in 3-4 weeks. We'll decrease her dose to 50 mcg. She feels better when her TSH is between 1 and 2.  Will follow closely.  I think the internal shakiness is from her anxiety. We discussed trying to take her Valium twice a day. She's actually written for 5 mg but only takes 2.5. I encouraged her to try taking 2.5 twice a day and see if this helps with the shakiness feeling that she's experiencing.  Palpitations-she has had complaints of palpitations on and off for a while. She's also tachycardic when she comes here.  tIme spent 40 min, >50% spent counseling about her headaches and thyroid.

## 2014-02-23 NOTE — Telephone Encounter (Signed)
Referral placed.Debra Singleton, Debra Singleton  

## 2014-03-01 ENCOUNTER — Encounter: Payer: Self-pay | Admitting: Physician Assistant

## 2014-03-01 ENCOUNTER — Ambulatory Visit (INDEPENDENT_AMBULATORY_CARE_PROVIDER_SITE_OTHER): Payer: 59

## 2014-03-01 ENCOUNTER — Telehealth: Payer: Self-pay | Admitting: Family Medicine

## 2014-03-01 ENCOUNTER — Ambulatory Visit (INDEPENDENT_AMBULATORY_CARE_PROVIDER_SITE_OTHER): Payer: 59 | Admitting: Physician Assistant

## 2014-03-01 ENCOUNTER — Encounter: Payer: Self-pay | Admitting: Family Medicine

## 2014-03-01 VITALS — BP 107/66 | HR 77 | Temp 98.0°F | Ht 68.0 in | Wt 145.0 lb

## 2014-03-01 DIAGNOSIS — J209 Acute bronchitis, unspecified: Secondary | ICD-10-CM

## 2014-03-01 DIAGNOSIS — R0602 Shortness of breath: Secondary | ICD-10-CM

## 2014-03-01 DIAGNOSIS — R059 Cough, unspecified: Secondary | ICD-10-CM

## 2014-03-01 DIAGNOSIS — R05 Cough: Secondary | ICD-10-CM

## 2014-03-01 DIAGNOSIS — R079 Chest pain, unspecified: Secondary | ICD-10-CM

## 2014-03-01 DIAGNOSIS — R062 Wheezing: Secondary | ICD-10-CM

## 2014-03-01 LAB — TSH: TSH: 1.082 u[IU]/mL (ref 0.350–4.500)

## 2014-03-01 LAB — T3, FREE: T3, Free: 3.1 pg/mL (ref 2.3–4.2)

## 2014-03-01 LAB — T4, FREE: Free T4: 1.84 ng/dL — ABNORMAL HIGH (ref 0.80–1.80)

## 2014-03-01 MED ORDER — ALBUTEROL SULFATE (2.5 MG/3ML) 0.083% IN NEBU
2.5000 mg | INHALATION_SOLUTION | Freq: Once | RESPIRATORY_TRACT | Status: AC
  Administered 2014-03-01: 2.5 mg via RESPIRATORY_TRACT

## 2014-03-01 MED ORDER — AZITHROMYCIN 250 MG PO TABS: ORAL_TABLET | ORAL | Status: DC

## 2014-03-01 MED ORDER — FLUTICASONE PROPIONATE 50 MCG/ACT NA SUSP: 2.0000 | Freq: Every day | NASAL | Status: AC

## 2014-03-01 NOTE — Telephone Encounter (Signed)
Patient Comments: We are moving to [place] this week. I was just seen by Debra Singleton today. Thank you for all the care you've provided to me!

## 2014-03-01 NOTE — Patient Instructions (Addendum)
flonase 2 sprays each nostril.  Start zpak.  Will get CXR.  Acute Bronchitis Bronchitis is inflammation of the airways that extend from the windpipe into the lungs (bronchi). The inflammation often causes mucus to develop. This leads to a cough, which is the most common symptom of bronchitis.  In acute bronchitis, the condition usually develops suddenly and goes away over time, usually in a couple weeks. Smoking, allergies, and asthma can make bronchitis worse. Repeated episodes of bronchitis may cause further lung problems.  CAUSES Acute bronchitis is most often caused by the same virus that causes a cold. The virus can spread from person to person (contagious).  SIGNS AND SYMPTOMS   Cough.   Fever.   Coughing up mucus.   Body aches.   Chest congestion.   Chills.   Shortness of breath.   Sore throat.  DIAGNOSIS  Acute bronchitis is usually diagnosed through a physical exam. Tests, such as chest X-rays, are sometimes done to rule out other conditions.  TREATMENT  Acute bronchitis usually goes away in a couple weeks. Often times, no medical treatment is necessary. Medicines are sometimes given for relief of fever or cough. Antibiotics are usually not needed but may be prescribed in certain situations. In some cases, an inhaler may be recommended to help reduce shortness of breath and control the cough. A cool mist vaporizer may also be used to help thin bronchial secretions and make it easier to clear the chest.  HOME CARE INSTRUCTIONS  Get plenty of rest.   Drink enough fluids to keep your urine clear or pale yellow (unless you have a medical condition that requires fluid restriction). Increasing fluids may help thin your secretions and will prevent dehydration.   Only take over-the-counter or prescription medicines as directed by your health care provider.   Avoid smoking and secondhand smoke. Exposure to cigarette smoke or irritating chemicals will make bronchitis  worse. If you are a smoker, consider using nicotine gum or skin patches to help control withdrawal symptoms. Quitting smoking will help your lungs heal faster.   Reduce the chances of another bout of acute bronchitis by washing your hands frequently, avoiding people with cold symptoms, and trying not to touch your hands to your mouth, nose, or eyes.   Follow up with your health care provider as directed.  SEEK MEDICAL CARE IF: Your symptoms do not improve after 1 week of treatment.  SEEK IMMEDIATE MEDICAL CARE IF:  You develop an increased fever or chills.   You have chest pain.   You have severe shortness of breath.  You have bloody sputum.   You develop dehydration.  You develop fainting.  You develop repeated vomiting.  You develop a severe headache. MAKE SURE YOU:   Understand these instructions.  Will watch your condition.  Will get help right away if you are not doing well or get worse. Document Released: 10/04/2004 Document Revised: 04/29/2013 Document Reviewed: 02/17/2013 [facility] Patient Information 2015 [place], [place]. This information is not intended to replace advice given to you by your health care provider. Make sure you discuss any questions you have with your health care provider.

## 2014-03-01 NOTE — Progress Notes (Signed)
   Subjective:    Patient ID: Debra Singleton, female    DOB: Jan 11, 1981, 33 y.o.   MRN: [id number]  HPI Pt is a 33 yo female who presents to the clinic with ST, cough, sinus drainage, chest tightness for last 6 days. She felt like was just a cold but symptoms have continued to worsen. Taking benadryl and cough rx OTC. Cough is episodic but productive. Describes sputum as greenish/yellow. Chest does feel tight and worried because she does hav ea tendency to get pneumonia. Leaving tomorrow to go to [place]. No fever, chills, n/v/d.   Review of Systems  All other systems reviewed and are negative.      Objective:   Physical Exam  Constitutional: She is oriented to person, place, and time. She appears well-developed and well-nourished.  HENT:  Head: Normocephalic and atraumatic.  Right Ear: External ear normal.  Left Ear: External ear normal.  TM's clear bilaterally.  Negative maxillary or frontal sinus tenderness to palpation.   Bilateral turbinates red and swollen.   Oropharynx erythematous with PND.   Eyes: Conjunctivae are normal. Right eye exhibits no discharge. Left eye exhibits no discharge.  Neck: Normal range of motion. Neck supple.  Non-tender anterior cervical adenopathy, bilateral.   Cardiovascular: Normal rate, regular rhythm and normal heart sounds.   Pulmonary/Chest: Effort normal and breath sounds normal.  Wheezing over apex of right lung.   Neurological: She is alert and oriented to person, place, and time.  Skin: Skin is dry.  Psychiatric: She has a normal mood and affect. Her behavior is normal.          Assessment & Plan:  Acute bronchitis/wheezing- albuterol nebulizer given in office today. Some improvement with neb. CXR ordered since she will be traveling and need to know for follow up. Given zpak. flonase for nasal congestion. HO given. Follow up if not improving.

## 2014-03-02 LAB — THYROID PEROXIDASE ANTIBODY

## 2014-03-17 ENCOUNTER — Ambulatory Visit: Payer: 59 | Admitting: Family Medicine

## 2014-04-07 ENCOUNTER — Ambulatory Visit (INDEPENDENT_AMBULATORY_CARE_PROVIDER_SITE_OTHER): Payer: 59 | Admitting: Physician Assistant

## 2014-04-07 ENCOUNTER — Encounter: Payer: Self-pay | Admitting: Physician Assistant

## 2014-04-07 VITALS — BP 114/72 | HR 92 | Ht 68.0 in | Wt 149.1 lb

## 2014-04-07 DIAGNOSIS — D1801 Hemangioma of skin and subcutaneous tissue: Secondary | ICD-10-CM

## 2014-04-07 DIAGNOSIS — M940 Chondrocostal junction syndrome [Tietze]: Secondary | ICD-10-CM

## 2014-04-07 DIAGNOSIS — R059 Cough, unspecified: Secondary | ICD-10-CM

## 2014-04-07 DIAGNOSIS — R058 Other specified cough: Secondary | ICD-10-CM

## 2014-04-07 DIAGNOSIS — I781 Nevus, non-neoplastic: Secondary | ICD-10-CM

## 2014-04-07 DIAGNOSIS — M24552 Contracture, left hip: Secondary | ICD-10-CM

## 2014-04-07 DIAGNOSIS — M6240 Contracture of muscle, unspecified site: Secondary | ICD-10-CM

## 2014-04-07 DIAGNOSIS — R05 Cough: Secondary | ICD-10-CM

## 2014-04-07 MED ORDER — HYDROCODONE-HOMATROPINE 5-1.5 MG/5ML PO SYRP: 5.0000 mL | ORAL_SOLUTION | Freq: Every evening | ORAL | Status: AC | PRN

## 2014-04-07 MED ORDER — PREDNISONE 50 MG PO TABS: ORAL_TABLET | ORAL | Status: AC

## 2014-04-07 MED ORDER — CHLORPHENIRAMINE MALEATE 4 MG PO TABS: ORAL_TABLET | ORAL | Status: AC

## 2014-04-07 NOTE — Progress Notes (Signed)
   Subjective:    Patient ID: Debra Singleton, female    DOB: 04-10-1981, 33 y.o.   MRN: [id number]  HPI Pt is a 33 yo female who presents to the clinic with multiple concerns.   She had bronchitis last week and was given zpak. She feels much better but still has a lingering dry cough. Worse at night and when she lays down. If she starts she just can't stop. Denies any SOB or wheezing. No fever, chills, n/v/d. Tried tessalon pearle with no benefit. Cough drops are not helping as well. She now has a lot of rib chest tightness and pain. Worse with deep breathing.   She is concerned with red spots on her arm. They do not hurt or bleed. She has notice more and more popping up. She wants to know what they are. Mostly on her arms.   She also continues to have left groin pain. She had UTI a couple of weeks ago and contributed the pain to that. She denies any trauma or injury. Worse with movement of leg. No vaginal pain or discharge. No urinary symptoms.    Review of Systems  All other systems reviewed and are negative.      Objective:   Physical Exam  Constitutional: She is oriented to person, place, and time. She appears well-developed and well-nourished.  HENT:  Head: Normocephalic and atraumatic.  Right Ear: External ear normal.  Left Ear: External ear normal.  Nose: Nose normal.  Mouth/Throat: Oropharynx is clear and moist.  Eyes: Conjunctivae are normal. Right eye exhibits no discharge. Left eye exhibits no discharge.  Neck: Normal range of motion. Neck supple.  Cardiovascular: Normal rate, regular rhythm and normal heart sounds.   Pulmonary/Chest: Effort normal and breath sounds normal. She has no wheezes.  Abdominal: Soft. Bowel sounds are normal.  Tenderness over left inguinal area. No masses felt.   Lymphadenopathy:    She has no cervical adenopathy.  Neurological: She is alert and oriented to person, place, and time.  Skin: Skin is dry.  Cherry angiomas over bilateral arms  and trunk.   Psychiatric: She has a normal mood and affect. Her behavior is normal.          Assessment & Plan:  Post viral cough/controcondritis- treated with prednisone for 5 days. Hycodan for cough at bedtime. Chlor-trimeton at bedtime to help with PND. Follow up if not improving or worsening.   Cherry angiomas- reassured pt of benign nature.   Left hip flexor strain- pain seems consistent with left hip flexor strain. Exercise given. Prednisone may help. Ibuprofen for next couple of days. Reassured no masses palpated. Follow up if not improving in 2 weeks.   Spent 30 minutes with patient and greater than 50 percent of visit spent counseling pt regarding all 3 diagnosis.

## 2014-04-07 NOTE — Patient Instructions (Addendum)
Cherry Angioma Cherry angiomas are noncancerous (benign) skin growths. They are made up of a clump of blood vessels. They occur most often in people over the age of 38. CAUSES  The cause of these skin growths is unknown, but they appear to run in families. SYMPTOMS  Cherry angiomas are smooth, round, red bumps on the skin. They can be as small as a pinhead or as big as a pencil eraser. The color may darken to a purplish red over time. Cherry angiomas are usually found on the trunk, but they can occur anywhere on the body. They are painless, but they may bleed if they are injured. The bleeding is not serious and will stop when firm pressure is applied. DIAGNOSIS  Your health care provider can usually tell what is wrong by performing a physical exam. A tissue sample (biopsy) may also be taken and examined under a microscope. TREATMENT  Usually no treatment is needed for cherry angiomas. They may be removed for improved appearance (cosmetic) reasons. Sometimes, cherry angiomas come back after removal. Removal methods include:  Electrocautery. Heat is used to burn the growth off the skin.  Cryosurgery. Liquid nitrogen is applied to the growth to freeze it. The growth eventually falls off the skin.  Surgery. HOME CARE INSTRUCTIONS  If your skin was covered with a bandage, change and remove the bandage as directed by your health care provider.  Check your skin regularly for any changes. SEEK MEDICAL CARE IF: You notice any changes or new growths on your skin. Document Released: 11/05/2001 Document Revised: 01/11/2014 Document Reviewed: 10/05/2011 [facility] Patient Information 2015 [place], [place]. This information is not intended to replace advice given to you by your health care provider. Make sure you discuss any questions you have with your health care provider.   HIP FLEXOR exercises. Ibuprofen helps with inflammation.

## 2014-04-08 ENCOUNTER — Telehealth: Payer: Self-pay | Admitting: *Deleted

## 2014-04-08 ENCOUNTER — Telehealth: Payer: Self-pay

## 2014-04-08 DIAGNOSIS — H538 Other visual disturbances: Secondary | ICD-10-CM

## 2014-04-08 NOTE — Telephone Encounter (Signed)
Pt left vm stating that she saw the neuro yesterday & they want her to see an ophlamologist.  She is asking for a referral.  I called her back to get the specific name of the dr she wants it sent to.  Neuro seems to think she may have a build up of CSF in her head.

## 2014-04-08 NOTE — Telephone Encounter (Signed)
Order placed

## 2014-04-08 NOTE — Telephone Encounter (Signed)
Pt informed with understanding./Tara Wich,CMA 

## 2014-04-08 NOTE — Telephone Encounter (Signed)
Pt called and stated that she seen the neuro doctor and he wants her to see an opthalmology first before he does any procedures on her.They told her that her PCP will have to do the referral for that . She would like to go to [facility] in [facility] and see Dr. Collins [place] Radiochenko.. Is it ok is I put that in for her./Kalim Kissel,CMA

## 2014-04-08 NOTE — Telephone Encounter (Signed)
Referral placed.

## 2014-04-09 ENCOUNTER — Encounter: Payer: Self-pay | Admitting: Family Medicine

## 2014-04-09 DIAGNOSIS — D1801 Hemangioma of skin and subcutaneous tissue: Secondary | ICD-10-CM | POA: Insufficient documentation

## 2014-04-09 DIAGNOSIS — I781 Nevus, non-neoplastic: Secondary | ICD-10-CM

## 2014-04-09 DIAGNOSIS — M24559 Contracture, unspecified hip: Secondary | ICD-10-CM | POA: Insufficient documentation

## 2014-08-16 IMAGING — CR DG CHEST 2V
2 series · 2 of 2 positions shown · non-contrast
Comparison: None.

CLINICAL DATA: Shortness of breath.  Anterior chest tightness.
Mitral valve prolapse.

CHEST - 2 VIEW

[view not recorded (1 of 2)]
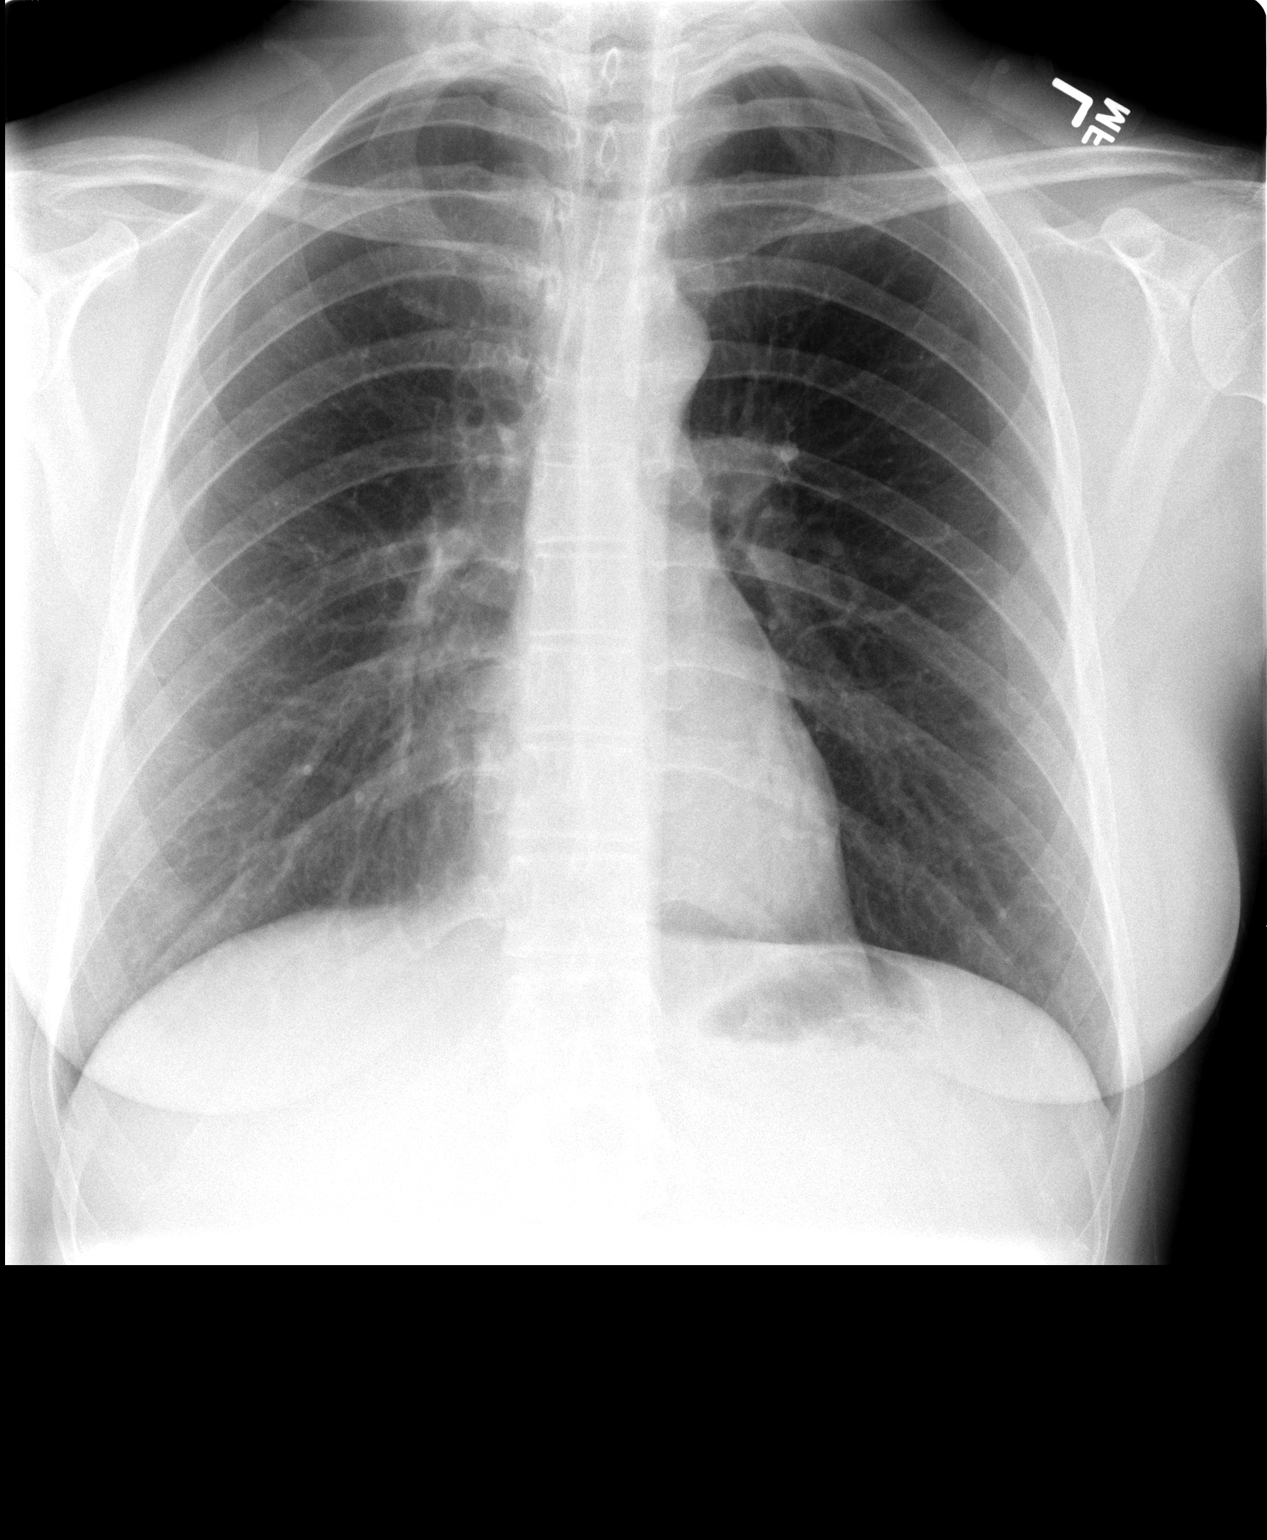

[view not recorded (2 of 2)]
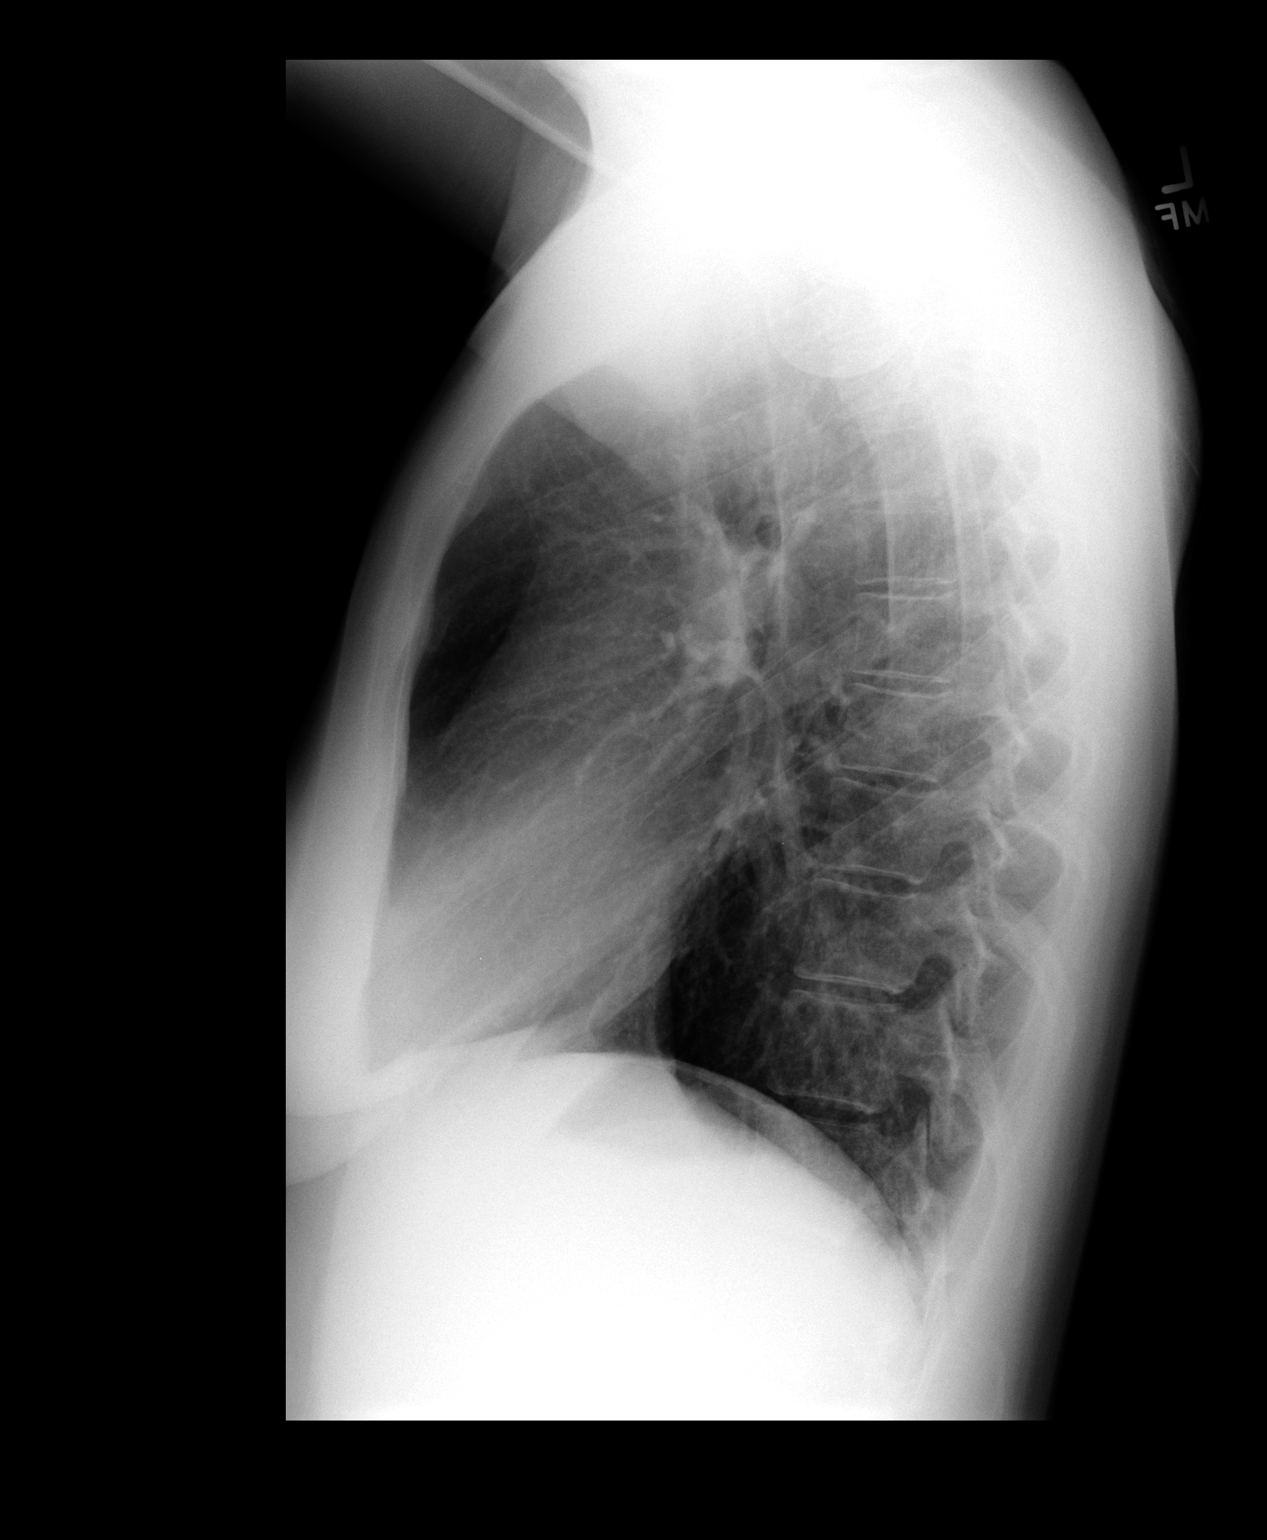

[2 of 2 positions shown; findings below may reference images not displayed]

FINDINGS: The heart size and mediastinal contours are within
normal limits.  Both lungs are clear.  The visualized skeletal
structures are unremarkable.
IMPRESSION: No active cardiopulmonary disease.

## 2015-07-26 IMAGING — CR DG CHEST 2V
2 series · 2 of 2 positions shown · non-contrast
Comparison: 03/22/2013.

CLINICAL DATA: 32-year-old female with cough shortness of breath
and left anterior lower chest pain. Initial encounter.

EXAM:
CHEST  2 VIEW

[view not recorded (1 of 2)]
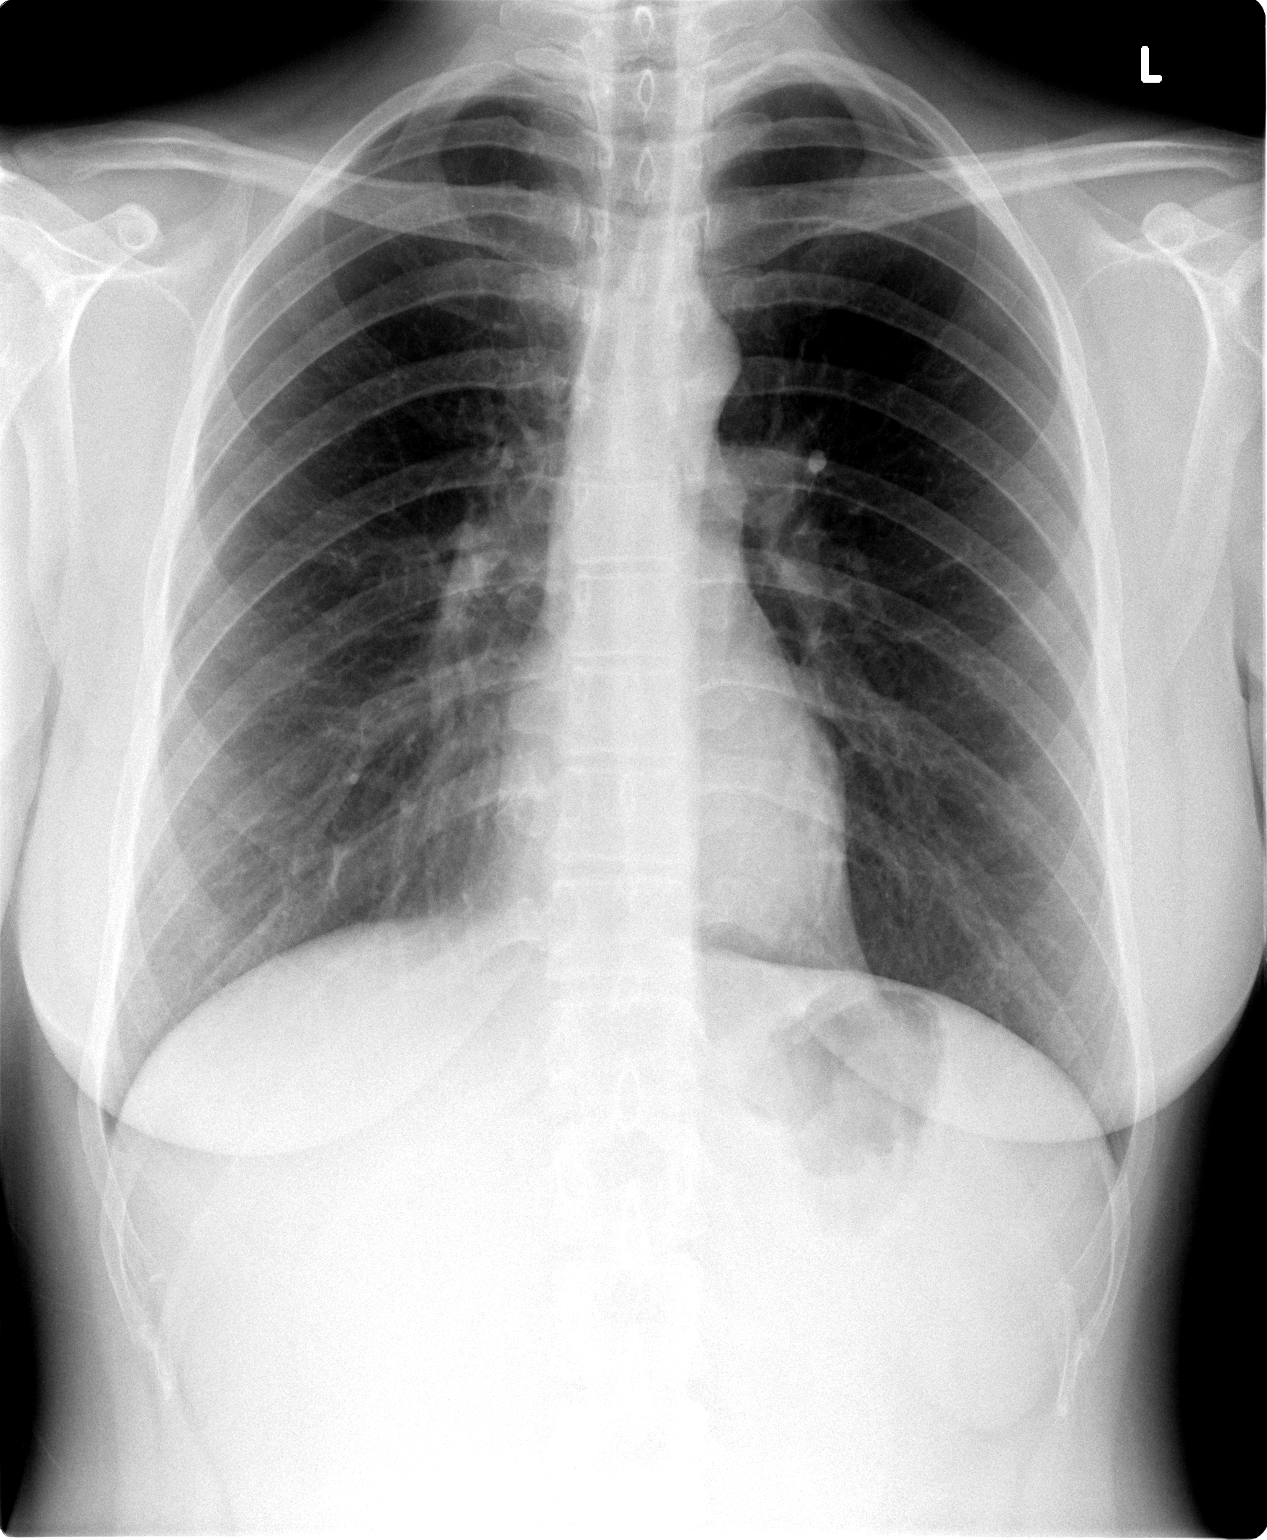

[view not recorded (2 of 2)]
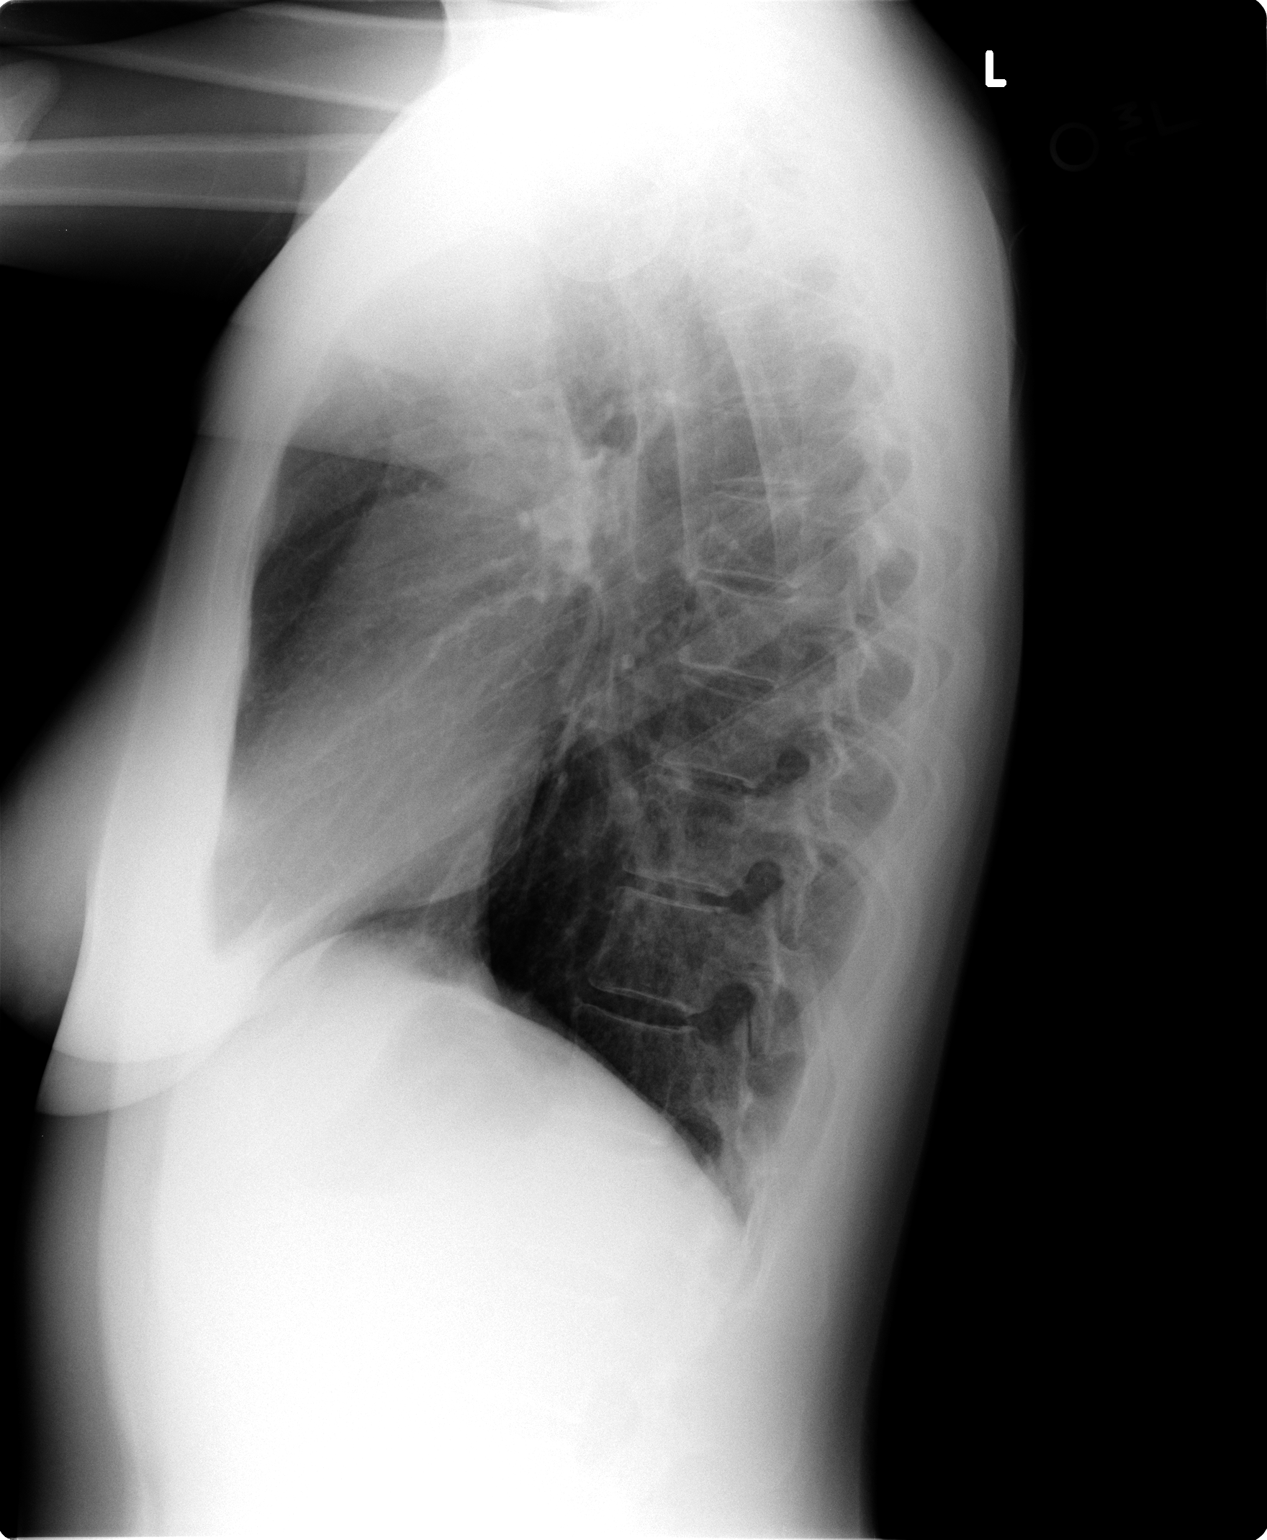

[2 of 2 positions shown; findings below may reference images not displayed]

FINDINGS: Stable and normal lung volumes. Stable and normal mediastinal
contours. Visualized tracheal air column is within normal limits. No
pneumothorax, pulmonary edema, pleural effusion, or acute or
confluent pulmonary opacity. No osseous abnormality identified.
IMPRESSION: Stable and negative, no acute cardiopulmonary abnormality.

## 2017-01-28 ENCOUNTER — Encounter

## 2017-01-31 ENCOUNTER — Inpatient Hospital Stay: Payer: BLUE CROSS/BLUE SHIELD | Primary: Internal Medicine

## 2017-01-31 ENCOUNTER — Ambulatory Visit
Admit: 2017-01-31 | Discharge: 2017-01-31 | Payer: BLUE CROSS/BLUE SHIELD | Attending: Urology | Primary: Internal Medicine

## 2017-01-31 DIAGNOSIS — R310 Gross hematuria: Secondary | ICD-10-CM

## 2017-01-31 LAB — URINALYSIS WITH MICROSCOPIC
Bilirubin Urine: NEGATIVE
Epithelial Cells UA: 0 /HPF (ref 0–25)
Glucose, Ur: NEGATIVE
Ketones, Urine: NEGATIVE
Leukocyte Esterase, Urine: NEGATIVE
Nitrite, Urine: NEGATIVE
Protein, UA: NEGATIVE
RBC, UA: 5 /HPF (ref 0–2)
Specific Gravity, UA: >1.03 — ABNORMAL HIGH (ref 1.010–1.020)
Urobilinogen, Urine: NORMAL
WBC, UA: 0 /HPF (ref 0–5)
pH, UA: 6 (ref 5.0–9.0)

## 2017-01-31 NOTE — Progress Notes (Signed)
HPI:    Patient is a 36 y.o. female in no acute distress.  She is alert and oriented to person, place, and time.    New patient referral from A. Latanya Maudlin for frequent UTI.  Patient states that she has had four urinary tract infections since November of this year.  The only urine culture have available for review is from 10/2016 and this did show no growth.  Patient states when she feels like she has a urinary tract infection she does experience dysuria, frequency, urgency, incontinence.  Patient states she has struggled with urinary tract infections since age 32.  She does not feel her symptoms are worse with intercourse, and states that she has intercourse every other day.  She denies use of douches or feminine hygiene washes. She reports she did have pyelonephritis in 2014 and was treated on an outpatient basis. There was question of stone passage, but has never had stones prior. In 2014 did take Topamax following a concussion but only took this for a short time.  She reports daily bowel movements.  She does not consume known bladder irritants.  She does have regular periods, and denies known GYN abnormalities.  She has had 2 children and has been married and with the same partner for 13 years.  She denies dyspareunia. Baseline urinary status: frequency every 2 hours, denies nocturia, SUI wears one pad per day.  She does report that on Friday she had dysuria and gross hematuria with blood clots.  She is no longer having the hematuria, but continues to have intermittent dysuria.  She denies nausea, vomiting, or fevers.  Patient was never a smoker.  She works as a Engineer, civil (consulting).  There is suprapubic pain, but no flank pain.  PVR 34 ML.  She has never seen urology prior.    Past Medical History:   Diagnosis Date   . Concussion    . Frequent UTI    . Gilbert's syndrome    . MVP (mitral valve prolapse)    . Pyelonephritis    . Pyelonephritis    . Wears glasses      Past Surgical History:   Procedure Laterality Date   .  WISDOM TOOTH EXTRACTION       Outpatient Encounter Prescriptions as of 01/31/2017   Medication Sig Dispense Refill   . sulfamethoxazole-trimethoprim (BACTRIM DS;SEPTRA DS) 800-160 MG per tablet      . neomycin-polymyxin-dexameth (MAXITROL) 3.5-10000-0.1 ophthalmic suspension      . Multiple Vitamins-Minerals (THERAPEUTIC MULTIVITAMIN-MINERALS) tablet Take 1 tablet by mouth daily     . Cholecalciferol (VITAMIN D3) 2000 units CAPS Take by mouth     . phenazopyridine (PYRIDIUM) 200 MG tablet Take 200 mg by mouth 3 times daily as needed for Pain       No facility-administered encounter medications on file as of 01/31/2017.       Current Outpatient Prescriptions on File Prior to Visit   Medication Sig Dispense Refill   . Multiple Vitamins-Minerals (THERAPEUTIC MULTIVITAMIN-MINERALS) tablet Take 1 tablet by mouth daily     . Cholecalciferol (VITAMIN D3) 2000 units CAPS Take by mouth     . phenazopyridine (PYRIDIUM) 200 MG tablet Take 200 mg by mouth 3 times daily as needed for Pain       No current facility-administered medications on file prior to visit.      Ciprofloxacin and Topamax [topiramate]  Family History   Problem Relation Age of Onset   . No Known Problems Mother    .  No Known Problems Father      History   Smoking Status   . Never Smoker   Smokeless Tobacco   . Never Used       History   Alcohol Use No       Review of Systems   Constitutional: Negative for appetite change, chills and fever.   Eyes: Negative for pain, redness and visual disturbance.   Respiratory: Negative for cough, shortness of breath and wheezing.    Cardiovascular: Negative for chest pain and leg swelling.   Gastrointestinal: Negative for abdominal pain, constipation, nausea and vomiting.   Genitourinary: Positive for frequency, hematuria and pelvic pain. Negative for difficulty urinating, dysuria, flank pain, urgency, vaginal bleeding and vaginal discharge.   Musculoskeletal: Negative for back pain, joint swelling and myalgias.   Skin:  Negative for color change, rash and wound.   Neurological: Negative for dizziness, tremors and numbness.   Hematological: Negative for adenopathy. Does not bruise/bleed easily.       PHYSICAL EXAM:  Constitutional: Patient resting comfortably, in no acute distress.   Neuro: Alert and oriented to person place and time. Cranial nerves grossly intact.    Psych: Mood and affect normal.  Skin: Warm, dry  HEENT: normocephalic, atraumatic  Lymphatics: No palpable lymphadenopathy  Lungs: Respiratory effort normal, unlabored  Cardiovascular:  Normal peripheral pulses  Abdomen: Soft, non-tender, non-distended with no organomegaly or palpable masses.  GU: No CVA tenderness. Bladder tender and not distended.  Pelvic: Deferred  No results found for: BUN  No results found for: CREATININE    ASSESSMENT:   Diagnosis Orders   1. Gross hematuria  CT ABDOMEN PELVIS W WO CONTRAST Additional Contrast? None    Urinalysis with Microscopic    Urine culture clean catch   2. Frequent UTI  CT ABDOMEN PELVIS W WO CONTRAST Additional Contrast? None    Urinalysis with Microscopic    Urine culture clean catch           PLAN:  We will check a UA C&S today.  We will check a CT urogram due to gross hematuria.  This also monitor for stones.  If the CT is negative for stones, we will have patient return for office cystoscopy.  If it is positive for stones she will return to discuss CT results and discuss definitive stone treatment.  I did urge her to call the office with her complaints of urinary symptoms so we may follow her symptoms with urine cultures.  I did urge her to urinate after sex, wear cotton underwear, drink at minimum 80 ounces of water per day, and avoid bladder irritants.

## 2017-01-31 NOTE — Patient Instructions (Signed)
Thank you for enrolling in MyChart. Please follow the instructions below to securely access your online medical record. MyChart allows you to send messages to your doctor, view your test results, renew your prescriptions, schedule appointments, and more.     How Do I Sign Up?  1. In your Internet browser, go to [email or web address]  2. Click on the Sign Up Now link in the Sign In box. You will see the New Member Sign Up page.  3. Enter your MyChart Access Code exactly as it appears below. You will not need to use this code after you've completed the sign-up process. If you do not sign up before the expiration date, you must request a new code.  MyChart Access Code: Activation code not generated  Current MyChart Status: Patient Declined    4. Enter your Social Security Number ([id number]) and Date of Birth (mm/dd/yyyy) as indicated and click Submit. You will be taken to the next sign-up page.  5. Create a MyChart ID. This will be your MyChart login ID and cannot be changed, so think of one that is secure and easy to remember.  6. Create a MyChart password. You can change your password at any time.  7. Enter your Password Reset Question and Answer. This can be used at a later time if you forget your password.   8. Enter your e-mail address. You will receive e-mail notification when new information is available in MyChart.  9. Click Sign Up. You can now view your medical record.     Additional Information  If you have questions, please contact your physician practice where you receive care. Remember, MyChart is NOT to be used for urgent needs. For medical emergencies, dial 911.

## 2017-01-31 NOTE — Progress Notes (Signed)
Call pt - urine cx reviewed and negative for UTI. Keep cysto as scheduled.

## 2017-01-31 NOTE — Progress Notes (Signed)
Bladderscan performed in office today:  Pt voided - 100 mL, PVR - 34 mL

## 2017-01-31 NOTE — Telephone Encounter (Signed)
I called [facility]ick[facility] at [facility] and let her know that the CT urogram was already authorized.  The auth # is [id number]. Valid 01/31/17-03/01/17.

## 2017-02-01 ENCOUNTER — Inpatient Hospital Stay: Admit: 2017-02-01 | Payer: BLUE CROSS/BLUE SHIELD | Primary: Internal Medicine

## 2017-02-01 ENCOUNTER — Encounter

## 2017-02-01 DIAGNOSIS — R310 Gross hematuria: Secondary | ICD-10-CM

## 2017-02-01 LAB — URINE CULTURE CLEAN CATCH
Culture: NO GROWTH
Specimen Description: 44883

## 2017-02-01 MED ORDER — NORMAL SALINE FLUSH 0.9 % IV SOLN
0.9 % | INTRAVENOUS | Status: DC | PRN
Start: 2017-02-01 — End: 2017-02-04
  Administered 2017-02-01: 20:00:00 10 mL via INTRAVENOUS

## 2017-02-01 MED ORDER — SODIUM CHLORIDE 0.9 % IV BOLUS
0.9 % | Freq: Once | INTRAVENOUS | Status: AC
Start: 2017-02-01 — End: 2017-02-01
  Administered 2017-02-01: 20:00:00 100 mL via INTRAVENOUS

## 2017-02-01 MED ORDER — IOPAMIDOL 76 % IV SOLN
76 % | Freq: Once | INTRAVENOUS | Status: AC | PRN
Start: 2017-02-01 — End: 2017-02-01
  Administered 2017-02-01: 20:00:00 120 mL via INTRAVENOUS

## 2017-02-25 ENCOUNTER — Ambulatory Visit
Admit: 2017-02-25 | Discharge: 2017-02-25 | Payer: BLUE CROSS/BLUE SHIELD | Attending: Urology | Primary: Internal Medicine

## 2017-02-25 DIAGNOSIS — R310 Gross hematuria: Secondary | ICD-10-CM

## 2017-02-25 NOTE — Progress Notes (Signed)
Subjective:      Patient ID: Kelsey Downs is a 36 y.o. female.    HPI  Patient is a 37 y.o. female in no acute distress.  She is alert and oriented to person, place, and time.     History  New patient referral from A. Latanya Maudlin for frequent UTI.  Patient states that she has had four urinary tract infections since November of this year.  The only urine culture have available for review is from 10/2016 and this did show no growth.  Patient states when she feels like she has a urinary tract infection she does experience dysuria, frequency, urgency, incontinence.  Patient states she has struggled with urinary tract infections since age 73.  She does not feel her symptoms are worse with intercourse, and states that she has intercourse every other day.  She denies use of douches or feminine hygiene washes. She reports she did have pyelonephritis in 2014 and was treated on an outpatient basis. There was question of stone passage, but has never had stones prior. In 2014 did take Topamax following a concussion but only took this for a short time.  She reports daily bowel movements.  She does not consume known bladder irritants.  She does have regular periods, and denies known GYN abnormalities.  She has had 2 children and has been married and with the same partner for 13 years.  She denies dyspareunia. Baseline urinary status: frequency every 2 hours, denies nocturia, SUI wears one pad per day.  She does report that on Friday she had dysuria and gross hematuria with blood clots.  She is no longer having the hematuria, but continues to have intermittent dysuria.  She denies nausea, vomiting, or fevers.  Patient was never a smoker.  She works as a Engineer, civil (consulting).  There is suprapubic pain, but no flank pain.  PVR 34 ML.  She has never seen urology prior.        Currently  Patient is here today for lower tract visualization.  This is second 2 gross hematuria.  Patient did get a recent CT urogram.  This film was independently  reviewed today.this does not show any significant GU abnormalities.  There are no calculi identified.  Patient has been doing well since her last appointment.    Cystoscopy Procedure Note    Indications:    Diagnosis Orders   1. Gross hematuria  PR CYSTOURETHROSCOPY   2. Frequent UTI  PR CYSTOURETHROSCOPY   3. Mixed incontinence         Pre-operative Diagnosis:    Diagnosis Orders   1. Gross hematuria  PR CYSTOURETHROSCOPY   2. Frequent UTI  PR CYSTOURETHROSCOPY   3. Mixed incontinence         Post-operative Diagnosis: Same    Surgeon: Isaiah Blakes     Assistants: None    Anesthesia : Local    Procedure Details   The risks, benefits, complications, treatment options, and expected outcomes were discussed with the patient. The patient concurred with the proposed plan, giving informed consent.    Cystoscopy was performed today under local anesthesia, using sterile technique. The patient was placed in the dorsal lithotomy position, prepped with CHG, and draped in the usual sterile fashion. A 14 French flexible cystoscope was used to systematically inspect both the urethra and bladder in their entirety.    Findings:  Anterior urethra: normal without strictures  Hyperplasia: na  Bladder: Normal mucosa, without lesions.  Ureteral orifice(s) was/were seen in the  normal position and effluxing clear urine  Trabeculations No  Diverticulum No  Description: normal anatomy         Specimens: none                 Complications:  None; patient tolerated the procedure well.           Disposition: home           Condition: stable      Past Medical History:   Diagnosis Date   . Concussion    . Frequent UTI    . Gilbert's syndrome    . Mixed incontinence 02/25/2017   . MVP (mitral valve prolapse)    . Pyelonephritis    . Pyelonephritis    . Wears glasses      Past Surgical History:   Procedure Laterality Date   . WISDOM TOOTH EXTRACTION       Family History   Problem Relation Age of Onset   . No Known Problems Mother    . No Known  Problems Father      Current Outpatient Prescriptions on File Prior to Visit   Medication Sig Dispense Refill   . Multiple Vitamins-Minerals (THERAPEUTIC MULTIVITAMIN-MINERALS) tablet Take 1 tablet by mouth daily     . sulfamethoxazole-trimethoprim (BACTRIM DS;SEPTRA DS) 800-160 MG per tablet      . neomycin-polymyxin-dexameth (MAXITROL) 3.5-10000-0.1 ophthalmic suspension      . Cholecalciferol (VITAMIN D3) 2000 units CAPS Take by mouth     . phenazopyridine (PYRIDIUM) 200 MG tablet Take 200 mg by mouth 3 times daily as needed for Pain       No current facility-administered medications on file prior to visit.       Outpatient Encounter Prescriptions as of 02/25/2017   Medication Sig Dispense Refill   . Multiple Vitamins-Minerals (THERAPEUTIC MULTIVITAMIN-MINERALS) tablet Take 1 tablet by mouth daily     . sulfamethoxazole-trimethoprim (BACTRIM DS;SEPTRA DS) 800-160 MG per tablet      . neomycin-polymyxin-dexameth (MAXITROL) 3.5-10000-0.1 ophthalmic suspension      . Cholecalciferol (VITAMIN D3) 2000 units CAPS Take by mouth     . phenazopyridine (PYRIDIUM) 200 MG tablet Take 200 mg by mouth 3 times daily as needed for Pain       No facility-administered encounter medications on file as of 02/25/2017.      Ciprofloxacin and Topamax [topiramate]  History   Smoking Status   . Never Smoker   Smokeless Tobacco   . Never Used     History   Alcohol Use No       Vitals:    02/25/17 1522   BP: 122/72     PHYSICAL EXAM:  Constitutional: Patient resting comfortably, in no acute distress.   Neuro: Alert and oriented to person place and time. Cranial nerves grossly intact.  Psych: Mood and affect normal.  Skin: Warm, dry  HEENT: normocephalic, atraumatic  Lymphatics: No palpable lymphadenopathy  Lungs: Respiratory effort normal, unlabored  Cardiovascular:  Normal peripheral pulses  Abdomen: Soft, non-tender, non-distended with no organomegaly or palpable masses.  GU: No CVA tenderness. Bladder non-tender and not  distended.  Pelvic: deferred      No results found for this visit on 02/25/17.  No results found for: BUN  No results found for: CREATININE  Review of Systems   Constitutional: Negative for appetite change, chills and fever.   Eyes: Negative for pain, redness and visual disturbance.   Respiratory: Negative for cough, shortness of breath and wheezing.  Cardiovascular: Negative for chest pain and leg swelling.   Gastrointestinal: Negative for abdominal pain, nausea and vomiting.   Genitourinary: Negative for difficulty urinating, dysuria, flank pain, frequency, hematuria, pelvic pain, vaginal bleeding and vaginal discharge.   Musculoskeletal: Negative for back pain, joint swelling and myalgias.   Skin: Negative for color change, rash and wound.   Neurological: Negative for dizziness, tremors and numbness.   Hematological: Negative for adenopathy. Does not bruise/bleed easily.       Objective:   Physical Exam    Assessment:      This is a 36 y.o. female with the following diagnoses:   Diagnosis Orders   1. Gross hematuria  PR CYSTOURETHROSCOPY   2. Frequent UTI  PR CYSTOURETHROSCOPY   3. Mixed incontinence           Plan:      Patient is clear from a gross hematuria standpoint.  I did offer the patient a post coital antibiotic but she does not want to take.  I also recommended that she use over-the-counter bladder analgesics.  I did recommend that she sends all of her urine cultures here to be treated.  I did recommend pelvic floor muscle training for her mixed incontinence.  Patient does not wish to proceed with this.  We will see her back in 3 months to see how she does.  I did ask the patient to pay attention to see if her urinary symptoms are cyclical.

## 2017-05-27 ENCOUNTER — Ambulatory Visit
Admit: 2017-05-27 | Discharge: 2017-05-27 | Payer: BLUE CROSS/BLUE SHIELD | Attending: Urology | Primary: Internal Medicine

## 2017-05-27 ENCOUNTER — Inpatient Hospital Stay: Payer: BLUE CROSS/BLUE SHIELD | Primary: Internal Medicine

## 2017-05-27 DIAGNOSIS — N390 Urinary tract infection, site not specified: Secondary | ICD-10-CM

## 2017-05-27 DIAGNOSIS — N3946 Mixed incontinence: Secondary | ICD-10-CM

## 2017-05-27 LAB — URINALYSIS WITH MICROSCOPIC
Bilirubin Urine: NEGATIVE
Epithelial Cells UA: 0 /HPF (ref 0–25)
Glucose, Ur: NEGATIVE
Ketones, Urine: NEGATIVE
Leukocyte Esterase, Urine: NEGATIVE
Nitrite, Urine: NEGATIVE
Protein, UA: NEGATIVE
Specific Gravity, UA: 1.025 — ABNORMAL HIGH (ref 1.010–1.020)
Urine Hgb: NEGATIVE
Urobilinogen, Urine: NORMAL
WBC, UA: 0 /HPF (ref 0–5)
pH, UA: 6 (ref 5.0–9.0)

## 2017-05-27 NOTE — Progress Notes (Signed)
Call pt - urine cx reviewed and negative for UTI & for significant microhematuria

## 2017-05-27 NOTE — Progress Notes (Signed)
Subjective:      Patient ID: Kelsey Downs is a 36 y.o. female.    HPI  Patient is a 36 y.o. female in no acute distress.  She is alert and oriented to person, place, and time.    History  Referral from A. Latanya Maudlin for frequent UTI.  Patient states that she has had four urinary tract infections since November of this year.  The only urine culture have available for review is from 10/2016 and this did show no growth.  Patient states when she feels like she has a urinary tract infection she does experience dysuria, frequency, urgency, incontinence.  Patient states she has struggled with urinary tract infections since age 36.  She does not feel her symptoms are worse with intercourse, and states that she has intercourse every other day.  She denies use of douches or feminine hygiene washes. She reports she did have pyelonephritis in 2014 and was treated on an outpatient basis. There was question of stone passage, but has never had stones prior. In 2014 did take Topamax following a concussion, but only took this for a short time.  She reports daily bowel movements.  She does not consume known bladder irritants.  She does have regular periods, and denies known GYN abnormalities.  She has had 2 children and has been married and with the same partner for 13 years.  She denies dyspareunia. Baseline urinary status: frequency every 2 hours, denies nocturia, SUI wears one pad per day.  She does report that on Friday she had dysuria and gross hematuria with blood clots.  She is no longer having the hematuria, but continues to have intermittent dysuria.  She denies nausea, vomiting, or fevers.  Patient was never a smoker.  She works as a Engineer, civil (consulting).  There is suprapubic pain, but no flank pain.  PVR 34 ML.  She has never seen urology prior.    02/2017 Gross hematuria. Cystoscopy and CT urogram negative.    Today  Here today for a 3 month follow-up for frequent UTI. Patient denies any current burning with urination, frequency  or hematuria. She does feel that when she has symptoms they are closer to her menstrual cycle. Her menstrual cycle is 10 days long. She has seen gynecology for this and they offered her birth control which she is not interested in taking. She does continue to complain of SUI soaking through 1 pad per day. She is interested in PFR.    Past Medical History:   Diagnosis Date   ??? Concussion    ??? Frequent UTI    ??? Gilbert's syndrome    ??? Mixed incontinence 02/25/2017   ??? MVP (mitral valve prolapse)    ??? Pyelonephritis    ??? Pyelonephritis    ??? Wears glasses      Past Surgical History:   Procedure Laterality Date   ??? WISDOM TOOTH EXTRACTION       Family History   Problem Relation Age of Onset   ??? No Known Problems Mother    ??? No Known Problems Father      Current Outpatient Prescriptions on File Prior to Visit   Medication Sig Dispense Refill   ??? Multiple Vitamins-Minerals (THERAPEUTIC MULTIVITAMIN-MINERALS) tablet Take 1 tablet by mouth daily     ??? Cholecalciferol (VITAMIN D3) 2000 units CAPS Take by mouth daily      ??? phenazopyridine (PYRIDIUM) 200 MG tablet Take 200 mg by mouth 3 times daily as needed for Pain  No current facility-administered medications on file prior to visit.       Outpatient Encounter Prescriptions as of 05/27/2017   Medication Sig Dispense Refill   ??? Multiple Vitamins-Minerals (THERAPEUTIC MULTIVITAMIN-MINERALS) tablet Take 1 tablet by mouth daily     ??? [DISCONTINUED] sulfamethoxazole-trimethoprim (BACTRIM DS;SEPTRA DS) 800-160 MG per tablet      ??? [DISCONTINUED] neomycin-polymyxin-dexameth (MAXITROL) 3.5-10000-0.1 ophthalmic suspension      ??? Cholecalciferol (VITAMIN D3) 2000 units CAPS Take by mouth daily      ??? phenazopyridine (PYRIDIUM) 200 MG tablet Take 200 mg by mouth 3 times daily as needed for Pain       No facility-administered encounter medications on file as of 05/27/2017.      Ciprofloxacin and Topamax [topiramate]  History   Smoking Status   ??? Never Smoker   Smokeless Tobacco   ???  Never Used     History   Alcohol Use No       Vitals:    05/27/17 1300   BP: 114/77   Pulse: 66     PHYSICAL EXAM:  Constitutional: Patient resting comfortably, in no acute distress.   Neuro: Alert and oriented to person place and time. Cranial nerves grossly intact.  Psych: Mood and affect normal.  Skin: Warm, dry  HEENT: normocephalic, atraumatic  Lymphatics: No palpable lymphadenopathy  Lungs: Respiratory effort normal, unlabored  Cardiovascular:  Normal peripheral pulses  Abdomen: Soft, non-tender, non-distended with no organomegaly or palpable masses.  GU: No CVA tenderness. Bladder non-tender and not distended.  Pelvic: Deferred    No results found for this visit on 05/27/17.  No results found for: BUN  No results found for: CREATININE  Review of Systems   Constitutional: Negative for appetite change, chills and fever.   Eyes: Negative for pain, redness and visual disturbance.   Respiratory: Negative for cough, shortness of breath and wheezing.    Cardiovascular: Negative for chest pain and leg swelling.   Gastrointestinal: Negative for abdominal pain, constipation, nausea and vomiting.   Genitourinary: Positive for enuresis (SUI). Negative for difficulty urinating, dysuria, flank pain, frequency, hematuria, pelvic pain, urgency, vaginal bleeding and vaginal discharge.   Musculoskeletal: Negative for back pain, joint swelling and myalgias.   Skin: Negative for color change, rash and wound.   Neurological: Negative for dizziness, tremors and numbness.   Hematological: Negative for adenopathy. Does not bruise/bleed easily.       Objective:   Physical Exam    Assessment:       Diagnosis Orders   1. Frequent UTI     2. Mixed incontinence             Plan:      I did urge her to follow up with gynecology for her cyclic oh urinary symptoms as they are likely gynecological in nature.    Discussed risks (including worsening symptoms, infection, pain) and benefits of pelvic floor rehab to help with SUI. We typically  schedule PFR once per week for 6 weeks initially, then re-evaluate at that time. Sessions typically last one hour each week. We will check an explanation of benefits with patient's insurance company prior to starting therapy. Patient is agreeable with this plan.         Landry Dyke, APRN - CNP

## 2017-05-28 LAB — URINE CULTURE CLEAN CATCH: Culture: NO GROWTH

## 2017-05-29 ENCOUNTER — Encounter: Attending: Urology | Primary: Internal Medicine

## 2017-05-29 NOTE — Telephone Encounter (Signed)
-----   Message from Landry DykeBethany W Parsell, APRN - CNP sent at 05/29/2017 10:08 AM EDT -----  Call pt - urine cx reviewed and negative for UTI & for significant microhematuria

## 2017-05-29 NOTE — Telephone Encounter (Signed)
Patient was advised of results.

## 2017-06-05 ENCOUNTER — Ambulatory Visit
Admit: 2017-06-05 | Discharge: 2017-06-05 | Payer: BLUE CROSS/BLUE SHIELD | Attending: Urology | Primary: Internal Medicine

## 2017-06-05 DIAGNOSIS — N3946 Mixed incontinence: Secondary | ICD-10-CM

## 2017-06-05 NOTE — Progress Notes (Signed)
PFR cancelled today due to patient being currently treated for BV. We will proceed with PFR next week.

## 2017-06-05 NOTE — Progress Notes (Signed)
INITIAL PELVIC FLOOR REHAB INTAKE    Symptoms  Daytime voiding frequency: 2  Nighttime voiding frequency: 0  Urgency: moderate  Incontinence episodes per day: 4 times a WEEK, Causes: bending, coughing, walking, sneezing, exercise, with a full bladder  Number of pads used per day: 0-1  Pelvic pain: yes - since the end of July due to bowel issues/pain present during sex  Bowel habits: 1 times per day , Fecal incontinence: no    Pertinent History  Previous pelvic surgery: no  Number vaginal deliveries: 0 Episiotomy: Yes  Number c-sections: 0  Urology medications/diuretics: none    Intake of spicy/citrus/tomato based foods: occasional  Caffeine intake per day: minimal   Fluid intake per day: 16-32 ounces daily  Alcohol intake: no  Smoking: No    Contraindications  Pacemaker: No  Pregnant/trying to conceive: No  Metal IUD? No  Unstable seizure disorder: No  Active UTI: UACS from 05/27/2017  reviewed and negative

## 2017-06-12 ENCOUNTER — Encounter: Attending: Urology | Primary: Internal Medicine

## 2017-06-19 ENCOUNTER — Ambulatory Visit
Admit: 2017-06-19 | Discharge: 2017-06-19 | Payer: BLUE CROSS/BLUE SHIELD | Attending: Urology | Primary: Internal Medicine

## 2017-06-19 DIAGNOSIS — N3946 Mixed incontinence: Secondary | ICD-10-CM

## 2017-06-19 NOTE — Patient Instructions (Signed)
Perform pelvic floor (PFR) home exercises 4 times per day in different positions (lying, sitting, standing).  Do this every day except for the day of in office PFR. Risks include worsening symptoms, infection, pain. Benefits of pelvic floor rehab will help with stress urinary incontinence. Urinary leakage when you cough, sneeze, laugh, lift, bend over, exercise, etc...

## 2017-06-19 NOTE — Progress Notes (Signed)
INITIAL PELVIC FLOOR REHAB INTAKE    Symptoms  Daytime voiding frequency: ever 2 hours  Nighttime voiding frequency: 0  Urgency: mild  Incontinence episodes per day: 1, Causes: coughing, sneezing, exercise, with a full bladder  Number of pads used per day: 1  Pelvic pain: no  Bowel habits: within normal limits , Fecal incontinence: no    Pertinent History  Previous pelvic surgery: no  Number vaginal deliveries: 2 Episiotomy: No  Number c-sections: 0  Urology medications/diuretics: none    Intake of spicy/citrus/tomato based foods: occasional  Caffeine intake per day: none  Fluid intake per day: 24-32 ounces  Alcohol intake: rare  Smoking: No    Contraindications  Pacemaker: No  Pregnant/trying to conceive: No  Metal IUD? No  Unstable seizure disorder: No  Active UTI: UACS from 05/27/2017 reviewed and negative    Was able to hold for 2 seconds at weak amplitude, manometry only.  Fatigue after 2 repetitions.     Home exercise regimen prescribed:   Repetitions - 4   Contract - 4 sec   Relax - 10 sec   + 8 Quick Flicks  Frequency- 4 times daily    Stimulated with 54 average mA for 8 minutes.

## 2017-06-26 ENCOUNTER — Ambulatory Visit
Admit: 2017-06-26 | Discharge: 2017-06-26 | Payer: BLUE CROSS/BLUE SHIELD | Attending: Urology | Primary: Internal Medicine

## 2017-06-26 DIAGNOSIS — N3946 Mixed incontinence: Secondary | ICD-10-CM

## 2017-06-26 NOTE — Progress Notes (Signed)
PELVIC FLOOR REHAB FOLLOW UP    At last visit (PFR # 1, 1 weeks ago):  Was able to hold for 2 seconds at weak amplitude, manometry only.  Fatigue after 2 repetitions.     Home exercise regimen prescribed:   Repetitions - 4   Contract - 4 sec   Relax - 10 sec   + 8 Quick Flicks  Frequency- 4 times daily    Stimulated with 54 average mA for 8 minutes.       Symptoms  Daytime voiding frequency: q 2 hrs, unchanged  Nighttime voiding frequency: 0 times per night, unchanged  Urgency: mild, unchanged    Incontinence episodes per day: 1, Causes: bending, coughing, lifting, standing, walking, laughing, sneezing, exercise, with urge, with a full bladder, unchanged  Number of pads used per day: 1, unchanged    Pelvic pain: worse, Patient states that she "feels bruised" she states that this is normal for her when she is close to ovulation.     Bowel habits: unchanged, daily  Fecal incontinence: unchanged, none    OVERALL IMPROVEMENT SINCE INITIAL SESSION:   minimal in degree    Compliance:  Has pt completed exercises as instructed: Yes    Changes Since Initial Visit  Intake of spicy/citrus/tomato based foods: unchanged  Caffeine intake per day: unchanged, none  Fluid intake per day: increased, 32 ounces a day  Alcohol intake: unchanged, none  Smoking: unchanged, none    Was able to hold for 4-6 seconds at weak, wavy amplitude.   Fatigue after 4 repetitions.     Home exercise regimen prescribed:   Repetitions - 4   Contract - 6 sec   Relax - 10 sec   + 8 Quick Flicks  Frequency- 4 times daily    Stimulated with 70 average mA for 10 minutes.

## 2017-07-03 ENCOUNTER — Ambulatory Visit
Admit: 2017-07-03 | Discharge: 2017-07-03 | Payer: BLUE CROSS/BLUE SHIELD | Attending: Urology | Primary: Internal Medicine

## 2017-07-03 DIAGNOSIS — N3946 Mixed incontinence: Secondary | ICD-10-CM

## 2017-07-03 NOTE — Progress Notes (Signed)
PELVIC FLOOR REHAB FOLLOW UP    At last visit (PFR # 2, 1 weeks ago):  Was able to hold for 4-6 seconds at weak, wavy amplitude.   Fatigue after 4 repetitions.     Home exercise regimen prescribed:   Repetitions - 4   Contract - 6 sec   Relax - 10 sec   + 8 Quick Flicks  Frequency- 4 times daily    Stimulated with 70 average mA for 10 minutes.     Symptoms  Daytime voiding frequency: q 1-2 hrs, unchanged  Nighttime voiding frequency: 0 times per night, unchanged  Urgency: mild, unchanged    Incontinence episodes per day: 1, Causes: coughing, sneezing, exercise, unchanged  Number of pads used per day: 1, unchanged    Pelvic pain: unchanged, none    Bowel habits: unchanged, daily BM  Fecal incontinence: unchanged, none    OVERALL IMPROVEMENT SINCE INITIAL SESSION:   minimal in degree    Compliance:  Has pt completed exercises as instructed: No: Patient states that she has only done exercises twice daily.     Changes Since Initial Visit  Intake of spicy/citrus/tomato based foods: unchanged  Caffeine intake per day: unchanged  Fluid intake per day: unchanged  Alcohol intake: one glass of wine  Smoking: unchanged, none    Was able to hold for 4-6 seconds at weak, wavy amplitude.   Fatigue after 4 repetitions. Using more abdominal muscles today.    Did not complete homework as prescribed.    Home exercise regimen prescribed:   Repetitions - 6   Contract - 6 sec   Relax - 10 sec   + 8 Quick Flicks  Frequency- 4 times daily    Stimulated with 70 average mA for 12 minutes.     Discussed the importance of completing homework as prescribed.

## 2017-07-10 ENCOUNTER — Ambulatory Visit
Admit: 2017-07-10 | Discharge: 2017-07-10 | Payer: BLUE CROSS/BLUE SHIELD | Attending: Urology | Primary: Internal Medicine

## 2017-07-10 DIAGNOSIS — N3946 Mixed incontinence: Secondary | ICD-10-CM

## 2017-07-10 NOTE — Progress Notes (Signed)
PELVIC FLOOR REHAB FOLLOW UP    At last visit (PFR # 3, 1 weeks ago):  Was able to hold for 4-6seconds at weak, wavyamplitude.   Fatigue after 4repetitions. Using more abdominal muscles today.    Did not complete homework as prescribed.    Home exercise regimen prescribed:   Repetitions - 6   Contract - 6 sec   Relax - 10 sec   + 8 Quick Flicks  Frequency- 4 times daily    Stimulated with 70 average mA for 12 minutes.     Symptoms  Daytime voiding frequency: q 1-2 hrs, unchanged  Nighttime voiding frequency: 0 times per night, unchanged  Urgency: mild, unchanged    Incontinence episodes per day: 1-2, Causes: exercise, increased, patient states that she does leak more prior to menstrual cycle.   Number of pads used per day: 1, unchanged    Pelvic pain: unchanged, slight discomfort due to PMS    Bowel habits: unchanged, daily BM  Fecal incontinence: unchanged, none    OVERALL IMPROVEMENT SINCE INITIAL SESSION:   minimal in degree, unchanged    Compliance:  Has pt completed exercises as instructed: Yes, plus.  Patient states that she was accidentally doing to many for the first few days then realized and did as prescribed    Changes Since Initial Visit  Intake of spicy/citrus/tomato based foods: unchanged  Caffeine intake per day: unchanged  Fluid intake per day: unchanged 24-32 ounces    Alcohol intake: unchanged, none  Smoking: unchanged, none    Was able to hold for 6 seconds at weak amplitude. Less wavy today, no abdominal muscles today  Fatigue after 4 repetitions.     Home exercise regimen prescribed:   Repetitions - 6   Contract - 6 sec   Relax - 10 sec   + 10 Quick Flicks  Frequency- 4 times daily    Stimulated with 65 average mA for 15 minutes.

## 2017-07-16 ENCOUNTER — Encounter: Attending: Family | Primary: Internal Medicine

## 2017-07-17 ENCOUNTER — Encounter: Attending: Family | Primary: Internal Medicine

## 2017-07-23 ENCOUNTER — Encounter: Attending: Family | Primary: Internal Medicine

## 2017-07-24 ENCOUNTER — Encounter: Attending: Urology | Primary: Internal Medicine

## 2017-07-31 ENCOUNTER — Encounter: Attending: Urology | Primary: Internal Medicine

## 2017-07-31 ENCOUNTER — Ambulatory Visit
Admit: 2017-07-31 | Discharge: 2017-07-31 | Payer: BLUE CROSS/BLUE SHIELD | Attending: Urology | Primary: Internal Medicine

## 2017-07-31 DIAGNOSIS — N3946 Mixed incontinence: Secondary | ICD-10-CM

## 2017-07-31 NOTE — Progress Notes (Signed)
PELVIC FLOOR REHAB FOLLOW UP    At last visit (PFR # 4, 3 weeks ago):  Was able to hold for 6 seconds at weak amplitude. Less wavy today, no abdominal muscles today  Fatigue after 4 repetitions.     Home exercise regimen prescribed:   Repetitions - 6   Contract - 6 sec   Relax - 10 sec   + 10 Quick Flicks  Frequency- 4 times daily    Stimulated with 65 average mA for 15 minutes.     Symptoms  Daytime voiding frequency: q 2 hrs, unchanged  Nighttime voiding frequency: 0 times per night, unchanged  Urgency: mild, decreased    Incontinence episodes per day: minimal, Causes: coughing, sneezing, exercise, decreased (patient states leaking has decreased due to not exercising)  Number of pads used per day: 0, decreased    Pelvic pain: unchanged    Bowel habits: worse, patient complains of recent diarrhea and worsening Hemorids   Fecal incontinence: unchanged, none    OVERALL IMPROVEMENT SINCE INITIAL SESSION:   minimal in degree    Compliance:  Has pt completed exercises as instructed: No: due to fatigue/illness/diarrhea    Changes Since Initial Visit  Intake of spicy/citrus/tomato based foods: unchanged  Caffeine intake per day: unchanged  Fluid intake per day: unchanged  Alcohol intake: unchanged  Smoking: unchanged, none    Was able to hold for 8 seconds at moderate amplitude.   Fatigue after 4 repetitions, but only due to rectal pain due to hemorrhoids    Home exercise regimen prescribed:   Repetitions - 6   Contract - 8 sec   Relax - 10 sec   + 10 Quick Flicks  Frequency- 4 times daily    Stimulated with 70 average mA for 15 minutes.

## 2017-08-05 ENCOUNTER — Ambulatory Visit
Admit: 2017-08-05 | Discharge: 2017-08-05 | Payer: BLUE CROSS/BLUE SHIELD | Attending: Urology | Primary: Internal Medicine

## 2017-08-05 DIAGNOSIS — N3946 Mixed incontinence: Secondary | ICD-10-CM

## 2017-08-05 NOTE — Progress Notes (Signed)
PELVIC FLOOR REHAB FOLLOW UP    At last visit (PFR # 5, 1 weeks ago):  Was able to hold for 8 seconds at moderate amplitude.   Fatigue after 4 repetitions, but only due to rectal pain due to hemorrhoids    Home exercise regimen prescribed:   Repetitions - 6   Contract - 8 sec   Relax - 10 sec   + 10 Quick Flicks  Frequency- 4 times daily    Stimulated with 70 average mA for 15 minutes.       Symptoms  Daytime voiding frequency: q 2 hrs, unchanged  Nighttime voiding frequency: 0 times per night, unchanged  Urgency: mild, decreased    Incontinence episodes per day: 1, Causes: coughing, sneezing, exercise, decreased  Number of pads used per day: 0-1, decreased    Pelvic pain: unchanged    Bowel habits: unchanged, daily  Fecal incontinence: unchanged    OVERALL IMPROVEMENT SINCE INITIAL SESSION:   mild in degree    Compliance:  Has pt completed exercises as instructed: No: Patient states that her heart continues to race while doing the exercises.     Changes Since Initial Visit  Intake of spicy/citrus/tomato based foods: unchanged  Caffeine intake per day: unchanged, none  Fluid intake per day: unchanged  Alcohol intake: unchanged  Smoking: unchanged, none    Was able to hold for 8 seconds at moderate amplitude.   Fatigue after 7 repetitions.     Home exercise regimen prescribed:   Repetitions - 8   Contract - 10 sec   Relax - 10 sec   + 10 Quick Flicks  Frequency- 4 times daily    Stimulated with 70 average mA for 15 minutes.

## 2017-08-08 ENCOUNTER — Telehealth

## 2017-08-08 NOTE — Telephone Encounter (Signed)
Patient made aware of response to have UAC/S repeated. Patient voiced understanding and voiced she [facility]. Will fax orders to facility.

## 2017-08-08 NOTE — Telephone Encounter (Signed)
She will need to repeat the UACS once antibiotics are complete. Order placed.

## 2017-08-08 NOTE — Telephone Encounter (Signed)
Patient called and advised that she was seen by her OB doctor for symptoms and her urine culture is positive for E-coli. Patient has been started on Keflex.

## 2017-08-09 NOTE — Telephone Encounter (Signed)
[facility]ders fax[facility]

## 2017-08-20 ENCOUNTER — Encounter

## 2017-08-21 ENCOUNTER — Encounter: Attending: Urology | Primary: Internal Medicine

## 2017-08-23 NOTE — Telephone Encounter (Signed)
There is a urine culture in the chart

## 2017-08-23 NOTE — Telephone Encounter (Signed)
Urine culture not completed as ordered.

## 2017-08-28 ENCOUNTER — Encounter: Attending: Urology | Primary: Internal Medicine

## 2019-09-01 ENCOUNTER — Ambulatory Visit
Admit: 2019-09-01 | Discharge: 2019-09-01 | Payer: PRIVATE HEALTH INSURANCE | Attending: Advanced Practice Midwife | Primary: Internal Medicine

## 2019-09-01 DIAGNOSIS — R300 Dysuria: Secondary | ICD-10-CM

## 2019-09-01 LAB — POCT URINALYSIS DIPSTICK
Spec Grav, UA: 1.02
pH, UA: 6

## 2019-09-01 NOTE — Telephone Encounter (Signed)
Referral sent.

## 2019-09-01 NOTE — Telephone Encounter (Signed)
Please refer to [facility] urology per patient request.  Has been seen by urology in [place] and desires second opinion

## 2019-09-01 NOTE — Progress Notes (Signed)
PROBLEM VISIT     Date of service: 09/01/2019    Kelsey GulaJennifer M Downs  Is a 38 y.o. married female    PT's PCP is: Youlanda MightyStephen D Mills, MD     DOB: 1981/04/09                                          Review of Systems   Constitutional: Negative.    HENT: Negative.    Gastrointestinal: Negative.    Genitourinary: Positive for dysuria, frequency and urgency. Negative for menstrual problem, vaginal bleeding, vaginal discharge and vaginal pain.   Musculoskeletal: Negative.    Neurological: Negative.        Patient's last menstrual period was 08/10/2019 (exact date).   OB History   Gravida Para Term Preterm AB Living   2 2 2     2    SAB TAB Ectopic Molar Multiple Live Births             2      # Outcome Date GA Lbr Len/2nd Weight Sex Delivery Anes PTL Lv   2 Term 11/26/11 3073w0d   F Vag-Spont EPI  LIV   1 Term 05/07/10 6625w0d   F Vag-Spont EPI  LIV        Social History     Tobacco Use   Smoking Status Never Smoker   Smokeless Tobacco Never Used        Social History     Substance and Sexual Activity   Alcohol Use No       Allergies: Ciprofloxacin and Topamax [topiramate]      Current Outpatient Medications:   ???  meloxicam (MOBIC) 15 MG tablet, meloxicam meloxicam 15 mg oral tablet 01/01/2019 take 1 tablet (15 mg) by oral route once daily 01-01-2019  [facility] ([id number]), Disp: , Rfl:   ???  Ascorbic Acid (VITAMIN C) 250 MG tablet, Take 500 mg by mouth as needed, Disp: , Rfl:   ???  Multiple Vitamins-Minerals (THERAPEUTIC MULTIVITAMIN-MINERALS) tablet, Take 1 tablet by mouth daily, Disp: , Rfl:   ???  Cholecalciferol (VITAMIN D3) 2000 units CAPS, Take by mouth daily , Disp: , Rfl:   ???  amitriptyline (ELAVIL) 25 MG tablet, amitriptyline 25 mg tablet  1 TAB BEDTIME 4 DAYS, 2 TABS BEDTIME 4 DAYS THEN TAKE 3 TABLETS AT BEDTIME, Disp: , Rfl:   ???  cephALEXin (KEFLEX) 500 MG capsule, , Disp: , Rfl:   ???  gabapentin (NEURONTIN) 100 MG capsule, gabapentin 100 mg capsule  TAKE 1-3 CAPSULES BY MOUTH AT BEDTIME, Disp:  , Rfl:     Social History     Substance and Sexual Activity   Sexual Activity Yes   ??? Partners: Male     Last Pap: 2017    Chief Complaint   Patient presents with   ??? Urinary Frequency     Pt c/o urinary frequency and urgency. [facility] and then a few says later went to [facility]. Pt had culture done and it was negative. Pt taking antibotics for past 4 days. Denies itch, odor. States sx are getting better since taking antibotics.        Physical Exam  Constitutional:       Appearance: Normal appearance. She is normal weight.   HENT:      Head: Normocephalic.   Eyes:  Pupils: Pupils are equal, round, and reactive to light.   Neck:      Musculoskeletal: Normal range of motion.   Pulmonary:      Effort: Pulmonary effort is normal.   Musculoskeletal: Normal range of motion.   Neurological:      Mental Status: She is alert and oriented to person, place, and time.   Skin:     General: Skin is warm.   Psychiatric:         Mood and Affect: Mood normal.         Behavior: Behavior normal.   Vitals signs and nursing note reviewed.         Vital Signs  Blood pressure (!) 100/90, height 5\' 7"  (1.702 m), weight 152 lb (68.9 kg), last menstrual period 08/10/2019, not currently breastfeeding.       HPI Desires recheck of urine today - via dipstick.  Recent urgent care showed hematuria (per patient) with negative culture but dysuria and frequency are resolving with use of abx.  Reports frequent UTI's in the past showing hematuria with negative culture.  Denies all vaginal infection s/s                      Results reviewed today:    Results for orders placed or performed in visit on 09/01/19   POCT Urinalysis Dipstick (No Micro)   Result Value Ref Range    Color, UA      Clarity, UA      Glucose, UA POC -     Bilirubin, UA -     Ketones, UA -     Spec Grav, UA 1.020     Blood, UA POC -     pH, UA 6.0     Protein, UA POC -     Urobilinogen, UA -     Leukocytes, UA -     Nitrite, UA -        Assessment and  Plan          Diagnosis Orders   1. Dysuria  POCT Urinalysis Dipstick (No Micro)       Discussed that since resolution of her s/s continue antibiotic course as prescribed.  Discussed urine dip today negative for UTI therefore no culture to be sent.  Discussed recurrent UTI and prevention - possibly consider IC diet.  Has seen urology in [place] in the past - would like to have consult with [facility] - will send referral.  Discussed possibly cystoscopy due to hematuria in the past without positive cultures.  Discussed low suspect as endometriosis being cuase of bladder s/s due to no painful cycles, no pelvic pain, no pain with intercourse/bowel movements.    I am having Clearnce Sorrel. [place] maintain her therapeutic multivitamin-minerals, Vitamin D3, vitamin C, amitriptyline, cephALEXin, gabapentin, and meloxicam.    Return in about 1 month (around 10/02/2019), or Due for pap smear, for Yearly.    There are no Patient Instructions on file for this visit.    Over 50% of time spent on counseling and care coordination on: see assessment and plan,  She was also counseled on her preventative health maintenance recommendations and follow-up.        FF time: 15 min      Crecencio Kwiatek C Elliona Doddridge,09/01/2019 11:35 AM     Electronically signed by Rhona Raider, APRN - CNM

## 2019-11-06 NOTE — Telephone Encounter (Signed)
Called patient and told her we need a referral from a physician. She said she had a referral from her ob doctor. Told her they need to call us or fax records over.

## 2019-11-06 NOTE — Telephone Encounter (Signed)
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  Thanks

## 2024-03-28 ENCOUNTER — Encounter: Primary: Internal Medicine
